# Patient Record
Sex: Male | Born: 1945
Health system: Southern US, Community
[De-identification: ages and names within clinical notes are randomized; demographics above are authoritative.]

## PROBLEM LIST (undated history)

## (undated) DIAGNOSIS — I10 Essential (primary) hypertension: Secondary | ICD-10-CM

## (undated) DIAGNOSIS — R609 Edema, unspecified: Secondary | ICD-10-CM

## (undated) DIAGNOSIS — U071 COVID-19: Secondary | ICD-10-CM

## (undated) DIAGNOSIS — K5792 Diverticulitis of intestine, part unspecified, without perforation or abscess without bleeding: Secondary | ICD-10-CM

## (undated) DIAGNOSIS — M1990 Unspecified osteoarthritis, unspecified site: Secondary | ICD-10-CM

## (undated) DIAGNOSIS — C801 Malignant (primary) neoplasm, unspecified: Secondary | ICD-10-CM

## (undated) DIAGNOSIS — K769 Liver disease, unspecified: Secondary | ICD-10-CM

## (undated) HISTORY — DX: Essential (primary) hypertension: I10

## (undated) HISTORY — DX: COVID-19: U07.1

## (undated) HISTORY — PX: BACK SURGERY: SHX140

## (undated) HISTORY — DX: Liver disease, unspecified: K76.9

## (undated) HISTORY — PX: VASECTOMY: SHX75

---

## 2004-10-20 DIAGNOSIS — I10 Essential (primary) hypertension: Secondary | ICD-10-CM | POA: Insufficient documentation

## 2005-04-20 DIAGNOSIS — Z720 Tobacco use: Secondary | ICD-10-CM | POA: Insufficient documentation

## 2005-10-22 DIAGNOSIS — R319 Hematuria, unspecified: Secondary | ICD-10-CM | POA: Insufficient documentation

## 2006-10-08 DIAGNOSIS — R509 Fever, unspecified: Secondary | ICD-10-CM | POA: Insufficient documentation

## 2006-10-17 DIAGNOSIS — N419 Inflammatory disease of prostate, unspecified: Secondary | ICD-10-CM | POA: Insufficient documentation

## 2007-02-05 DIAGNOSIS — H1030 Unspecified acute conjunctivitis, unspecified eye: Secondary | ICD-10-CM | POA: Insufficient documentation

## 2012-06-27 ENCOUNTER — Ambulatory Visit: Payer: Self-pay | Admitting: Family Medicine

## 2013-01-12 ENCOUNTER — Ambulatory Visit: Payer: Self-pay | Admitting: Family Medicine

## 2013-02-13 ENCOUNTER — Ambulatory Visit: Payer: Self-pay | Admitting: Diagnostic Radiology

## 2013-02-13 ENCOUNTER — Ambulatory Visit: Payer: Self-pay | Admitting: Family Medicine

## 2013-03-17 HISTORY — PX: MICRODISCECTOMY LUMBAR: SUR864

## 2014-04-16 DIAGNOSIS — M545 Low back pain, unspecified: Secondary | ICD-10-CM | POA: Insufficient documentation

## 2014-04-16 DIAGNOSIS — B019 Varicella without complication: Secondary | ICD-10-CM | POA: Insufficient documentation

## 2014-04-16 DIAGNOSIS — M5126 Other intervertebral disc displacement, lumbar region: Secondary | ICD-10-CM | POA: Insufficient documentation

## 2014-04-16 DIAGNOSIS — Z23 Encounter for immunization: Secondary | ICD-10-CM | POA: Insufficient documentation

## 2014-04-16 DIAGNOSIS — Z9229 Personal history of other drug therapy: Secondary | ICD-10-CM | POA: Insufficient documentation

## 2014-04-16 DIAGNOSIS — Z87891 Personal history of nicotine dependence: Secondary | ICD-10-CM | POA: Insufficient documentation

## 2014-04-16 DIAGNOSIS — Z136 Encounter for screening for cardiovascular disorders: Secondary | ICD-10-CM | POA: Insufficient documentation

## 2014-04-16 DIAGNOSIS — Z125 Encounter for screening for malignant neoplasm of prostate: Secondary | ICD-10-CM | POA: Insufficient documentation

## 2014-05-26 DIAGNOSIS — H524 Presbyopia: Secondary | ICD-10-CM | POA: Diagnosis not present

## 2014-05-26 DIAGNOSIS — H5210 Myopia, unspecified eye: Secondary | ICD-10-CM | POA: Diagnosis not present

## 2014-06-29 ENCOUNTER — Ambulatory Visit (INDEPENDENT_AMBULATORY_CARE_PROVIDER_SITE_OTHER): Payer: Commercial Managed Care - HMO | Admitting: Family Medicine

## 2014-06-29 ENCOUNTER — Other Ambulatory Visit: Payer: Self-pay

## 2014-06-29 ENCOUNTER — Encounter: Payer: Self-pay | Admitting: Family Medicine

## 2014-06-29 VITALS — BP 138/70 | HR 56 | Temp 98.1°F | Resp 14 | Ht 68.0 in | Wt 179.8 lb

## 2014-06-29 DIAGNOSIS — I10 Essential (primary) hypertension: Secondary | ICD-10-CM

## 2014-06-29 DIAGNOSIS — Z0000 Encounter for general adult medical examination without abnormal findings: Secondary | ICD-10-CM | POA: Diagnosis not present

## 2014-06-29 LAB — HEMOCCULT GUIAC POC 1CARD (OFFICE): Fecal Occult Blood, POC: NEGATIVE

## 2014-06-29 LAB — POCT URINALYSIS DIPSTICK
Bilirubin, UA: NEGATIVE
Glucose, UA: NEGATIVE
Ketones, UA: NEGATIVE
LEUKOCYTES UA: NEGATIVE
Nitrite, UA: NEGATIVE
PH UA: 7
RBC UA: NEGATIVE
Spec Grav, UA: 1.015
UROBILINOGEN UA: 0.2

## 2014-06-29 NOTE — Progress Notes (Signed)
Patient: Kenneth Collins, Male    DOB: June 06, 1945, 69 y.o.   MRN: [id number] Visit Date: 06/29/2014  Today's Provider: Vernie Murders, PA   Chief Complaint  Patient presents with  . Annual Exam   Subjective:  Kenneth Collins is a 69 y.o. male who presents today for health maintenance and complete physical. He feels well. He reports exercising by 15 minutes of climbing stairs and then weight lifting for an hour.Marland Kitchen He reports he is sleeping fairly well (5-7 hours a night).   Review of Systems  Constitutional: Negative.   HENT: Negative.   Respiratory: Negative.   Cardiovascular: Negative.   Gastrointestinal: Negative.   Endocrine: Negative.   Genitourinary: Negative.   Musculoskeletal: Positive for arthralgias. Negative for joint swelling and gait problem.       Has had gout in the right first MTP joint 3 times between January and March 2016. Has made diet changes and all the symptoms have stopped.  Skin: Negative.   Neurological: Negative.   Hematological: Negative.   Psychiatric/Behavioral: Negative.     History   Social History  . Marital Status: Married    Spouse Name: N/A  . Number of Children: N/A  . Years of Education: N/A   Occupational History  . Not on file.   Social History Main Topics  . Smoking status: Former Smoker    Types: Cigarettes  . Smokeless tobacco: Not on file  . Alcohol Use: No  . Drug Use: No  . Sexual Activity: Not on file   Other Topics Concern  . Not on file   Social History Narrative    Patient Active Problem List   Diagnosis Date Noted  . Chicken pox 04/16/2014  . Displacement of lumbar intervertebral disc without myelopathy 04/16/2014  . Personal history of tobacco use, presenting hazards to health 04/16/2014  . Received influenza vaccination at hospital 04/16/2014  . LBP (low back pain) 04/16/2014  . Need for vaccination 04/16/2014  . Pneumococcal vaccination given 04/16/2014  . Special screening for malignant neoplasm of prostate  04/16/2014  . Screening for ischemic heart disease 04/16/2014  . Basal cell papilloma 05/07/2008  . Screening for lipoid disorders 05/07/2008  . Acute conjunctivitis 02/05/2007  . Acute upper respiratory infection 10/17/2006  . Prostatitis 10/17/2006  . Difficult or painful urination 10/08/2006  . Febrile 10/08/2006  . Routine general medical examination at a health care facility 10/22/2005  . Blood in the urine 10/22/2005  . Tobacco use 04/20/2005  . Essential (primary) hypertension 10/20/2004    Past Surgical History  Procedure Laterality Date  . Vasectomy    . Microdiscectomy lumbar  03/17/2013    His family history includes Cancer in his maternal uncle; Heart disease in his father; Pneumonia in his mother.    Outpatient Prescriptions Prior to Visit  Medication Sig Dispense Refill  . Ascorbic Acid (VITAMIN C) 500 MG CAPS     . aspirin 81 MG tablet Take by mouth.    Marland Kitchen atenolol-chlorthalidone (TENORETIC) 50-25 MG per tablet Take 1 tablet by mouth daily.    . cholecalciferol (VITAMIN D) 1000 UNITS tablet     . co-enzyme Q-10 30 MG capsule     . MULTIPLE VITAMIN PO Take 1 tablet by mouth daily.    .       No facility-administered medications prior to visit.    Patient Care Team: Margo Common, PA as PCP - General (Physician Assistant)     Objective:   Vitals:  Filed Vitals:  06/29/14 1002  BP: 138/70  Pulse: 56  Temp: 98.1 F (36.7 C)  TempSrc: Oral  Resp: 14  Height: 5\' 8"  (1.727 m)  Weight: 179 lb 12.8 oz (81.557 kg)    Physical Exam  Constitutional: He appears well-developed and well-nourished.  HENT:  Head: Normocephalic and atraumatic.  Right Ear: External ear normal.  Left Ear: External ear normal.  Nose: Nose normal.  Mouth/Throat: Oropharynx is clear and moist.  Eyes: Conjunctivae and EOM are normal.  Neck: Normal range of motion. Neck supple.  Cardiovascular: Normal rate and intact distal pulses.   Pulmonary/Chest: Effort normal and  breath sounds normal.  Abdominal: Soft. Bowel sounds are normal.  Genitourinary: Rectum normal and prostate normal. Guaiac negative stool.  Musculoskeletal: Normal range of motion.  Neurological: He is alert. He has normal reflexes.  Skin: Skin is warm and dry.  Psychiatric: He has a normal mood and affect. His behavior is normal. Thought content normal.     Depression Screen PHQ 2/9 Scores 06/29/2014  PHQ - 2 Score 0      Assessment & Plan:    Routine Health Maintenance and Physical Exam - Good general health. Has recovered very well from lumbar microdiskectomy in 2015. Exercises regularly and continues to work. Last eye exam was May 2016 and dental exam scheduled 07-29-14. No falling incidences and no depression. Normal functional status. Blood pressure well controlled on Tenoretic daily and continues to take an ASA daily. Recheck annually.   Immunization History  Administered Date(s) Administered  . Pneumococcal Polysaccharide-23 10/09/2011  . Tdap 10/19/2010    Health Maintenance  Topic Date Due  . COLONOSCOPY  2006 - in [place]  . ZOSTAVAX  10/01/2003  . PNA vac Low Risk Adult (2 of 2 - PCV13) 10/08/2012  . INFLUENZA VACCINE  08/02/2014  . TETANUS/TDAP  10/18/2020      Discussed health benefits of physical activity, and encouraged him to engage in regular exercise appropriate for his age and condition.    ------------------------------------------------------------------------------------------------------------

## 2014-06-30 LAB — CBC WITH DIFFERENTIAL/PLATELET
BASOS: 0 %
Basophils Absolute: 0 10*3/uL (ref 0.0–0.2)
EOS (ABSOLUTE): 0.2 10*3/uL (ref 0.0–0.4)
EOS: 2 %
HEMATOCRIT: 47.1 % (ref 37.5–51.0)
HEMOGLOBIN: 15.9 g/dL (ref 12.6–17.7)
Immature Grans (Abs): 0 10*3/uL (ref 0.0–0.1)
Immature Granulocytes: 0 %
LYMPHS ABS: 2.5 10*3/uL (ref 0.7–3.1)
LYMPHS: 27 %
MCH: 31.5 pg (ref 26.6–33.0)
MCHC: 33.8 g/dL (ref 31.5–35.7)
MCV: 94 fL (ref 79–97)
MONOCYTES: 6 %
Monocytes Absolute: 0.6 10*3/uL (ref 0.1–0.9)
NEUTROS ABS: 6 10*3/uL (ref 1.4–7.0)
Neutrophils: 65 %
Platelets: 202 10*3/uL (ref 150–379)
RBC: 5.04 x10E6/uL (ref 4.14–5.80)
RDW: 13.5 % (ref 12.3–15.4)
WBC: 9.3 10*3/uL (ref 3.4–10.8)

## 2014-06-30 LAB — COMPREHENSIVE METABOLIC PANEL
ALT: 39 IU/L (ref 0–44)
AST: 37 IU/L (ref 0–40)
Albumin/Globulin Ratio: 1.4 (ref 1.1–2.5)
Albumin: 3.8 g/dL (ref 3.5–4.8)
Alkaline Phosphatase: 59 IU/L (ref 39–117)
BUN/Creatinine Ratio: 18 (ref 10–22)
BUN: 30 mg/dL — ABNORMAL HIGH (ref 8–27)
Bilirubin Total: 0.6 mg/dL (ref 0.0–1.2)
CALCIUM: 9 mg/dL (ref 8.6–10.2)
CO2: 26 mmol/L (ref 18–29)
CREATININE: 1.63 mg/dL — AB (ref 0.76–1.27)
Chloride: 101 mmol/L (ref 97–108)
GFR calc Af Amer: 49 mL/min/{1.73_m2} — ABNORMAL LOW (ref >59)
GFR calc non Af Amer: 42 mL/min/{1.73_m2} — ABNORMAL LOW (ref >59)
Globulin, Total: 2.7 g/dL (ref 1.5–4.5)
Glucose: 101 mg/dL — ABNORMAL HIGH (ref 65–99)
Potassium: 4.3 mmol/L (ref 3.5–5.2)
Sodium: 141 mmol/L (ref 134–144)
TOTAL PROTEIN: 6.5 g/dL (ref 6.0–8.5)

## 2014-06-30 LAB — LIPID PANEL
CHOLESTEROL TOTAL: 159 mg/dL (ref 100–199)
Chol/HDL Ratio: 3 ratio units (ref 0.0–5.0)
HDL: 53 mg/dL (ref >39)
LDL CALC: 90 mg/dL (ref 0–99)
TRIGLYCERIDES: 78 mg/dL (ref 0–149)
VLDL Cholesterol Cal: 16 mg/dL (ref 5–40)

## 2014-06-30 LAB — TSH: TSH: 1.72 u[IU]/mL (ref 0.450–4.500)

## 2014-07-01 ENCOUNTER — Telehealth: Payer: Self-pay

## 2014-07-01 NOTE — Telephone Encounter (Signed)
-----   Message from Margo Common, [place] sent at 07/01/2014 12:51 AM EDT ----- Normal blood cell counts, thyroid test and cholesterol levels. Creatinine level elevated and usually indicates stress on kidney function. Increase water intake and recheck levels in 3-4 weeks to evaluate progress.

## 2014-07-01 NOTE — Telephone Encounter (Signed)
Patient advised as directed below. Patient verbalized understanding and agrees with treatment plan. Patient scheduled for follow up appointment on 07/29/14.

## 2014-07-29 ENCOUNTER — Encounter: Payer: Self-pay | Admitting: Family Medicine

## 2014-07-29 ENCOUNTER — Ambulatory Visit (INDEPENDENT_AMBULATORY_CARE_PROVIDER_SITE_OTHER): Payer: Commercial Managed Care - HMO | Admitting: Family Medicine

## 2014-07-29 VITALS — BP 110/68 | HR 56 | Temp 98.4°F | Resp 16 | Wt 181.4 lb

## 2014-07-29 DIAGNOSIS — R7989 Other specified abnormal findings of blood chemistry: Secondary | ICD-10-CM

## 2014-07-29 DIAGNOSIS — R748 Abnormal levels of other serum enzymes: Secondary | ICD-10-CM

## 2014-07-29 NOTE — Progress Notes (Signed)
Subjective:    Patient ID: Kenneth Collins, male    DOB: 09-14-1945, 68 y.o.   MRN: [id number] Chief Complaint  Patient presents with  . Follow-up    HPI  Patient Active Problem List   Diagnosis Date Noted  . Chicken pox 04/16/2014  . Displacement of lumbar intervertebral disc without myelopathy 04/16/2014  . Personal history of tobacco use, presenting hazards to health 04/16/2014  . Received influenza vaccination at hospital 04/16/2014  . LBP (low back pain) 04/16/2014  . Need for vaccination 04/16/2014  . Pneumococcal vaccination given 04/16/2014  . Special screening for malignant neoplasm of prostate 04/16/2014  . Screening for ischemic heart disease 04/16/2014  . Basal cell papilloma 05/07/2008  . Screening for lipoid disorders 05/07/2008  . Acute conjunctivitis 02/05/2007  . Acute upper respiratory infection 10/17/2006  . Prostatitis 10/17/2006  . Difficult or painful urination 10/08/2006  . Febrile 10/08/2006  . Routine general medical examination at a health care facility 10/22/2005  . Blood in the urine 10/22/2005  . Tobacco use 04/20/2005  . Essential (primary) hypertension 10/20/2004   Past Surgical History  Procedure Laterality Date  . Vasectomy    . Microdiscectomy lumbar  03/17/2013   History  Substance Use Topics  . Smoking status: Former Smoker    Types: Cigarettes  . Smokeless tobacco: Not on file  . Alcohol Use: No   Family History  Problem Relation Age of Onset  . Heart disease Father   . Cancer Maternal Uncle   . Pneumonia Mother    Current Outpatient Prescriptions on File Prior to Visit  Medication Sig Dispense Refill  . Ascorbic Acid (VITAMIN C) 500 MG CAPS     . aspirin 81 MG tablet Take by mouth.    Marland Kitchen atenolol-chlorthalidone (TENORETIC) 50-25 MG per tablet Take 1 tablet by mouth daily.    . cholecalciferol (VITAMIN D) 1000 UNITS tablet     . co-enzyme Q-10 30 MG capsule     . MULTIPLE VITAMIN PO Take 1 tablet by mouth daily.    Marland Kitchen  NAPROXEN SODIUM PO      No current facility-administered medications on file prior to visit.   No Known Allergies  Review of Systems  Constitutional: Negative.   HENT: Negative.   Respiratory: Negative.   Cardiovascular: Negative.   Gastrointestinal: Negative.   Endocrine: Negative.   Genitourinary: Negative.   Hematological: Negative.       BP 110/68 mmHg  Pulse 56  Temp(Src) 98.4 F (36.9 C) (Oral)  Resp 16  Wt 181 lb 6.4 oz (82.283 kg)  Objective:   Physical Exam  Constitutional: He is oriented to person, place, and time. He appears well-developed and well-nourished. No distress.  HENT:  Head: Normocephalic and atraumatic.  Right Ear: Hearing normal.  Left Ear: Hearing normal.  Nose: Nose normal.  Eyes: Conjunctivae and lids are normal. Pupils are equal, round, and reactive to light. Right eye exhibits no discharge. Left eye exhibits no discharge. No scleral icterus.  Cardiovascular: Normal rate and regular rhythm.   Pulmonary/Chest: Effort normal. No respiratory distress.  Abdominal: Soft. Bowel sounds are normal.  Musculoskeletal: Normal range of motion.  Neurological: He is alert and oriented to person, place, and time.  Skin: Skin is intact. No lesion and no rash noted.  Psychiatric: He has a normal mood and affect. His speech is normal and behavior is normal. Thought content normal.      Assessment & Plan:  1. Elevated serum creatinine Has been drinking  more water and feeling well. Normal BP reading and still taking Tenoretic for BP control. Will recheck renal function and follow up pending reports. - Renal Function Panel

## 2014-07-30 ENCOUNTER — Telehealth: Payer: Self-pay

## 2014-07-30 LAB — RENAL FUNCTION PANEL
Albumin: 3.9 g/dL (ref 3.5–4.8)
BUN/Creatinine Ratio: 19 (ref 10–22)
BUN: 28 mg/dL — AB (ref 8–27)
CO2: 27 mmol/L (ref 18–29)
Calcium: 8.8 mg/dL (ref 8.6–10.2)
Chloride: 100 mmol/L (ref 97–108)
Creatinine, Ser: 1.49 mg/dL — ABNORMAL HIGH (ref 0.76–1.27)
GFR, EST AFRICAN AMERICAN: 54 mL/min/{1.73_m2} — AB (ref >59)
GFR, EST NON AFRICAN AMERICAN: 47 mL/min/{1.73_m2} — AB (ref >59)
Glucose: 104 mg/dL — ABNORMAL HIGH (ref 65–99)
Phosphorus: 2.8 mg/dL (ref 2.5–4.5)
Potassium: 4.1 mmol/L (ref 3.5–5.2)
Sodium: 141 mmol/L (ref 134–144)

## 2014-07-30 NOTE — Telephone Encounter (Signed)
Left patient a detailed message on voicemail (consent in chart) advising him of message below.

## 2014-07-30 NOTE — Telephone Encounter (Signed)
-----   Message from Margo Common, [place] sent at 07/30/2014  2:16 PM EDT ----- Kidney function improving. Continue to drink extra water and limit heat exposure. Recheck levels in 3 months.

## 2014-11-29 ENCOUNTER — Other Ambulatory Visit: Payer: Self-pay | Admitting: Family Medicine

## 2015-06-01 DIAGNOSIS — H5210 Myopia, unspecified eye: Secondary | ICD-10-CM | POA: Diagnosis not present

## 2015-06-01 DIAGNOSIS — H524 Presbyopia: Secondary | ICD-10-CM | POA: Diagnosis not present

## 2015-07-12 ENCOUNTER — Ambulatory Visit (INDEPENDENT_AMBULATORY_CARE_PROVIDER_SITE_OTHER): Payer: Commercial Managed Care - HMO | Admitting: Family Medicine

## 2015-07-12 ENCOUNTER — Encounter: Payer: Self-pay | Admitting: Family Medicine

## 2015-07-12 VITALS — BP 130/72 | HR 56 | Temp 98.9°F | Resp 16 | Ht 66.5 in | Wt 188.0 lb

## 2015-07-12 DIAGNOSIS — Z1211 Encounter for screening for malignant neoplasm of colon: Secondary | ICD-10-CM

## 2015-07-12 DIAGNOSIS — I10 Essential (primary) hypertension: Secondary | ICD-10-CM | POA: Diagnosis not present

## 2015-07-12 DIAGNOSIS — Z0000 Encounter for general adult medical examination without abnormal findings: Secondary | ICD-10-CM

## 2015-07-12 NOTE — Progress Notes (Signed)
Patient: Kenneth Collins, Male    DOB: [birth year], 70 y.o.   MRN: [id number] Visit Date: 07/12/2015  Today's Provider: Vernie Murders, PA   Chief Complaint  Patient presents with  . Annual wellness exam   Subjective:    Annual wellness visit Kenneth Collins is a 70 y.o. male. He feels well. He reports exercising 3 days a week. He reports he is sleeping well.  -----------------------------------------------------------    Review of Systems  Constitutional: Negative.   HENT: Negative.   Eyes: Negative.   Respiratory: Negative.   Cardiovascular: Negative.   Gastrointestinal: Negative.   Endocrine: Negative.   Genitourinary: Negative.   Musculoskeletal: Negative.   Skin: Negative.   Allergic/Immunologic: Negative.   Neurological: Negative.   Hematological: Negative.   Psychiatric/Behavioral: Negative.     Social History   Social History  . Marital Status: Married    Spouse Name: N/A  . Number of Children: N/A  . Years of Education: N/A   Occupational History  . Not on file.   Social History Main Topics  . Smoking status: Former Smoker    Types: Cigarettes  . Smokeless tobacco: Not on file  . Alcohol Use: 0.0 oz/week    0 Standard drinks or equivalent per week     Comment: 1-2 drinks a month.   . Drug Use: No  . Sexual Activity: Not on file   Other Topics Concern  . Not on file   Social History Narrative    No past medical history on file.   Patient Active Problem List   Diagnosis Date Noted  . Chicken pox 04/16/2014  . Displacement of lumbar intervertebral disc without myelopathy 04/16/2014  . Personal history of tobacco use, presenting hazards to health 04/16/2014  . Received influenza vaccination at hospital 04/16/2014  . LBP (low back pain) 04/16/2014  . Need for vaccination 04/16/2014  . Pneumococcal vaccination given 04/16/2014  . Special screening for malignant neoplasm of prostate 04/16/2014  . Screening for ischemic heart disease  04/16/2014  . Basal cell papilloma 05/07/2008  . Screening for lipoid disorders 05/07/2008  . Acute conjunctivitis 02/05/2007  . Acute upper respiratory infection 10/17/2006  . Prostatitis 10/17/2006  . Difficult or painful urination 10/08/2006  . Febrile 10/08/2006  . Routine general medical examination at a health care facility 10/22/2005  . Blood in the urine 10/22/2005  . Tobacco use 04/20/2005  . Essential (primary) hypertension 10/20/2004    Past Surgical History  Procedure Laterality Date  . Vasectomy    . Microdiscectomy lumbar  03/17/2013    His family history includes Cancer in his maternal uncle; Heart disease in his father; Pneumonia in his mother.    Current Meds  Medication Sig  . Ascorbic Acid (VITAMIN C) 500 MG CAPS   . aspirin 81 MG tablet Take by mouth.  Marland Kitchen atenolol-chlorthalidone (TENORETIC) 50-25 MG tablet TAKE 1 TABLET EVERY DAY  . cholecalciferol (VITAMIN D) 1000 UNITS tablet   . co-enzyme Q-10 30 MG capsule   . MULTIPLE VITAMIN PO Take 1 tablet by mouth daily.  Marland Kitchen NAPROXEN SODIUM PO     Patient Care Team: Margo Common, PA as PCP - General (Physician Assistant)    Objective:   Vitals: BP 130/72 mmHg  Pulse 56  Temp(Src) 98.9 F (37.2 C)  Resp 16  Ht 5' 6.5" (1.689 m)  Wt 188 lb (85.276 kg)  BMI 29.89 kg/m2  Physical Exam  Activities of Daily Living In your present state  of health, do you have any difficulty performing the following activities: 07/12/2015  Hearing? N  Vision? N  Difficulty concentrating or making decisions? N  Walking or climbing stairs? N  Dressing or bathing? N  Doing errands, shopping? N    Fall Risk Assessment Fall Risk  07/12/2015 06/29/2014  Falls in the past year? No No     Depression Screen PHQ 2/9 Scores 07/12/2015 06/29/2014  PHQ - 2 Score 0 0    Cognitive Testing - 6-CIT  Correct? Score   What year is it? yes 0 0 or 4  What month is it? yes 0 0 or 3  Memorize:    Pia Mau,  40,  [address]   [place],      What time is it? (within 1 hour) yes 0 0 or 3  Count backwards from 20 yes 0 0, 2, or 4  Name the months of the year yes 0 0, 2, or 4  Repeat name & address above yes 0 0, 2, 4, 6, 8, or 10       TOTAL SCORE  0/28   Interpretation:  Normal  Normal (0-7) Abnormal (8-28)       Assessment & Plan:     Annual Wellness Visit  Reviewed patient's Family Medical History Reviewed and updated list of patient's medical providers Assessment of cognitive impairment was done Assessed patient's functional ability Established a written schedule for health screening [place] Completed and Reviewed  Exercise Activities and Dietary recommendations Goals    Goes to the gym 3 times a week for an 1.5 hour workout with wife (30 minutes on treadmill and 60 minutes with weights)      Immunization History  Administered Date(s) Administered  . Pneumococcal Polysaccharide-23 10/09/2011  . Tdap 10/19/2010    Health Maintenance  Topic Date Due  . Hepatitis C Screening  [birth year]  . COLONOSCOPY  09/30/1993  . ZOSTAVAX  10/01/2003  . PNA vac Low Risk Adult (2 of 2 - PCV13) 10/08/2012  . INFLUENZA VACCINE  08/02/2015  . TETANUS/TDAP  10/18/2020       Discussed health benefits of physical activity, and encouraged him to engage in regular exercise appropriate for his age and condition.    ------------------------------------------------------------------------------------------------------------ 1. Encounter for Medicare annual wellness exam Good general health with history of lumbar microdiscectomy in 2015 without subsequent back pain. Good activity and exercise program. Will get labs for hep C screening in the future. Will check with insurance about coverage for Zostavax and Prevnar immunizations. - Hepatitis C Antibody; Future  2. Essential (primary) hypertension Stable and well controlled on the Tenoretic. Schedule labs and follow up in 6 months. - CBC  with Differential/Platelet; Future - Comprehensive metabolic panel; Future - Lipid panel; Future - TSH; Future  3. Colon cancer screening Last colonoscopy was normal without polyps in [place] in 2006. Will schedule follow up colonoscopy screening. - Ambulatory referral to Gastroenterology    Vernie Murders, [facility]

## 2015-07-15 ENCOUNTER — Other Ambulatory Visit: Payer: Self-pay

## 2015-07-15 ENCOUNTER — Telehealth: Payer: Self-pay

## 2015-07-15 NOTE — Telephone Encounter (Signed)
Gastroenterology Pre-Procedure Review  Request Date: 08/09/2015   Requesting Physician: Dr. Natale Milch  PATIENT REVIEW QUESTIONS: The patient responded to the following health history questions as indicated:    1. Are you having any GI issues? no 2. Do you have a personal history of Polyps? no 3. Do you have a family history of Colon Cancer or Polyps? no 4. Diabetes Mellitus? no 5. Joint replacements in the past 12 months?no 6. Major health problems in the past 3 months?no 7. Any artificial heart valves, MVP, or defibrillator?no    MEDICATIONS & ALLERGIES:    Patient reports the following regarding taking any anticoagulation/antiplatelet therapy:   Plavix, Coumadin, Eliquis, Xarelto, Lovenox, Pradaxa, Brilinta, or Effient? no Aspirin? yes (blood thinners)  Patient confirms/reports the following medications:  Current Outpatient Prescriptions  Medication Sig Dispense Refill  . Ascorbic Acid (VITAMIN C) 500 MG CAPS     . aspirin 81 MG tablet Take by mouth.    Marland Kitchen atenolol-chlorthalidone (TENORETIC) 50-25 MG tablet TAKE 1 TABLET EVERY DAY 90 tablet 3  . cholecalciferol (VITAMIN D) 1000 UNITS tablet     . co-enzyme Q-10 30 MG capsule     . MULTIPLE VITAMIN PO Take 1 tablet by mouth daily.    Marland Kitchen NAPROXEN SODIUM PO Reported on 07/15/2015     No current facility-administered medications for this visit.    Patient confirms/reports the following allergies:  No Known Allergies  No orders of the defined types were placed in this encounter.    AUTHORIZATION INFORMATION Primary Insurance: 1D#: Group #:  Secondary Insurance: 1D#: Group #:  SCHEDULE INFORMATION: Date: 08/09/2015 Time: Location: [facility]

## 2015-08-01 NOTE — Telephone Encounter (Signed)
Per [place], CPT code [id number] does not require Pre-Authorization. This Josem Kaufmann will be closed. If you have any questions, please reply or contact our call center at [phone].

## 2015-08-02 ENCOUNTER — Encounter: Payer: Self-pay | Admitting: Family Medicine

## 2015-08-02 ENCOUNTER — Ambulatory Visit (INDEPENDENT_AMBULATORY_CARE_PROVIDER_SITE_OTHER): Payer: Commercial Managed Care - HMO | Admitting: Family Medicine

## 2015-08-02 VITALS — BP 132/78 | HR 64 | Temp 100.2°F | Resp 20 | Wt 192.0 lb

## 2015-08-02 DIAGNOSIS — I10 Essential (primary) hypertension: Secondary | ICD-10-CM | POA: Diagnosis not present

## 2015-08-02 DIAGNOSIS — R1032 Left lower quadrant pain: Secondary | ICD-10-CM | POA: Diagnosis not present

## 2015-08-02 DIAGNOSIS — Z0000 Encounter for general adult medical examination without abnormal findings: Secondary | ICD-10-CM | POA: Diagnosis not present

## 2015-08-02 MED ORDER — CIPROFLOXACIN HCL 500 MG PO TABS: 500.0000 mg | ORAL_TABLET | Freq: Two times a day (BID) | ORAL | 0 refills | Status: DC

## 2015-08-02 NOTE — Progress Notes (Signed)
Patient: Kenneth Collins Male    DOB: August 30, 1945   70 y.o.   MRN: [id number] Visit Date: 08/02/2015  Today's Provider: Vernie Murders, PA   Chief Complaint  Patient presents with  . Abdominal Pain    since this morning.    Subjective:    HPI Patient reports that he has had pain since 3am this morning (about 12 hours). Patient reports that the pain is located in his left side. He reports that he had chills, but denies fever. Patient reports that he was dehyrated all day yesterday and thinks that this may have started his symptoms. Patient reports that this morning his pain was a 10 on the pain scale of 1-10. After taking Ibuprofen around 12pm today, he reports that his pain level came down to a 4. 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   No past medical history on file. Patient Active Problem List   Diagnosis Date Noted  . Chicken pox 04/16/2014  . Displacement of lumbar intervertebral disc without myelopathy 04/16/2014  . Personal history of tobacco use, presenting hazards to health 04/16/2014  . Received influenza vaccination at hospital 04/16/2014  . LBP (low back pain) 04/16/2014  . Need for vaccination 04/16/2014  . Pneumococcal vaccination given 04/16/2014  . Special screening for malignant neoplasm of prostate 04/16/2014  . Screening for ischemic heart disease 04/16/2014  . Basal cell papilloma 05/07/2008  . Screening for lipoid disorders 05/07/2008  . Acute conjunctivitis 02/05/2007  . Acute upper respiratory infection 10/17/2006  . Prostatitis 10/17/2006  . Difficult or painful urination 10/08/2006  . Febrile 10/08/2006  . Routine general medical examination at a health care facility 10/22/2005  . Blood in the urine 10/22/2005  . Tobacco use 04/20/2005  . Essential (primary) hypertension 10/20/2004   Past Surgical History:  Procedure Laterality Date  . MICRODISCECTOMY LUMBAR  03/17/2013  . VASECTOMY     Family History    Problem Relation Age of Onset  . Heart disease Father   . Cancer Maternal Uncle   . Pneumonia Mother    No Known Allergies   Current Outpatient Prescriptions on File Prior to Visit  Medication Sig Dispense Refill  . Ascorbic Acid (VITAMIN C) 500 MG CAPS     . aspirin 81 MG tablet Take by mouth.    Marland Kitchen atenolol-chlorthalidone (TENORETIC) 50-25 MG tablet TAKE 1 TABLET EVERY DAY 90 tablet 3  . cholecalciferol (VITAMIN D) 1000 UNITS tablet     . co-enzyme Q-10 30 MG capsule     . MULTIPLE VITAMIN PO Take 1 tablet by mouth daily.    Marland Kitchen NAPROXEN SODIUM PO Reported on 07/15/2015     No current facility-administered medications on file prior to visit.    Review of Systems  Constitutional: Positive for chills, fatigue and fever. Negative for activity change, appetite change, diaphoresis and unexpected weight change.  Respiratory: Negative.   Cardiovascular: Negative.   Genitourinary: Negative for difficulty urinating and dysuria.   Social History  Substance Use Topics  . Smoking status: Former Smoker    Types: Cigarettes  . Smokeless tobacco: Not on file  . Alcohol use 0.0 oz/week     Comment: 1-2 drinks a month.    Objective:   BP 132/78 (BP Location: Right Arm, Patient Position: Sitting, Cuff Size: Normal)   Pulse 64   Temp 100.2 F (37.9 C)   Resp 20   Wt 192 lb (87.1 kg)  SpO2 98%   BMI 30.53 kg/m   Physical Exam  Constitutional: He is oriented to person, place, and time. He appears well-developed and well-nourished.  HENT:  Head: Normocephalic.  Neck: Neck supple.  Cardiovascular: Normal rate and regular rhythm.   Pulmonary/Chest: Effort normal.  Abdominal: Soft. Bowel sounds are normal. He exhibits no distension and no mass. There is tenderness. There is guarding. There is no rebound.  Some left lower quadrant tenderness to palpation with some guarding. No rigidity or rebound localization. BS WNL.  Neurological: He is alert and oriented to person, place, and time.       Assessment & Plan:     1. LLQ abdominal pain Onset this morning. No hematuria or dysuria. Some frequency and LLQ pain with movement. Had normal BM today without change in discomfort. Urinalysis showed some protein but no leukocytes, blood or nitrites. States he was sure he had a kidney stone but has never had one to know. Will get CBC and CMP lab tests. Add Cipro for any UTI or diverticulitis since he has been feeling fatigued and has fever. Recommend increased fluid intake and bland diet. Recheck pending lab reports. - CBC with Differential/Platelet - Comprehensive metabolic panel - POCT urinalysis dipstick  2. Essential (primary) hypertension Stable without tachycardia. Tolerating Tenoretic without side effects. Will recheck labs and continue present dosage.  - Lipid panel - TSH  3. Encounter for Medicare annual wellness exam Screening for Hepatitis C and plan physical exam later pending response - Hepatitis C Antibody       Vernie Murders, PA  [facility]

## 2015-08-03 DIAGNOSIS — R1032 Left lower quadrant pain: Secondary | ICD-10-CM | POA: Diagnosis not present

## 2015-08-03 DIAGNOSIS — I10 Essential (primary) hypertension: Secondary | ICD-10-CM | POA: Diagnosis not present

## 2015-08-03 DIAGNOSIS — Z0000 Encounter for general adult medical examination without abnormal findings: Secondary | ICD-10-CM | POA: Diagnosis not present

## 2015-08-04 LAB — CBC WITH DIFFERENTIAL/PLATELET
BASOS: 0 %
Basophils Absolute: 0 10*3/uL (ref 0.0–0.2)
EOS (ABSOLUTE): 0 10*3/uL (ref 0.0–0.4)
EOS: 0 %
HEMATOCRIT: 42.7 % (ref 37.5–51.0)
Hemoglobin: 14.9 g/dL (ref 12.6–17.7)
IMMATURE GRANS (ABS): 0 10*3/uL (ref 0.0–0.1)
IMMATURE GRANULOCYTES: 0 %
LYMPHS: 6 %
Lymphocytes Absolute: 0.9 10*3/uL (ref 0.7–3.1)
MCH: 31.4 pg (ref 26.6–33.0)
MCHC: 34.9 g/dL (ref 31.5–35.7)
MCV: 90 fL (ref 79–97)
Monocytes Absolute: 0.6 10*3/uL (ref 0.1–0.9)
Monocytes: 4 %
NEUTROS PCT: 90 %
Neutrophils Absolute: 14.8 10*3/uL — ABNORMAL HIGH (ref 1.4–7.0)
PLATELETS: 193 10*3/uL (ref 150–379)
RBC: 4.75 x10E6/uL (ref 4.14–5.80)
RDW: 13.5 % (ref 12.3–15.4)
WBC: 16.4 10*3/uL — AB (ref 3.4–10.8)

## 2015-08-04 LAB — COMPREHENSIVE METABOLIC PANEL
A/G RATIO: 1.3 (ref 1.2–2.2)
ALT: 23 IU/L (ref 0–44)
AST: 20 IU/L (ref 0–40)
Albumin: 3.4 g/dL — ABNORMAL LOW (ref 3.5–4.8)
Alkaline Phosphatase: 54 IU/L (ref 39–117)
BUN/Creatinine Ratio: 15 (ref 10–24)
BUN: 30 mg/dL — ABNORMAL HIGH (ref 8–27)
Bilirubin Total: 1.1 mg/dL (ref 0.0–1.2)
CALCIUM: 8.3 mg/dL — AB (ref 8.6–10.2)
CO2: 25 mmol/L (ref 18–29)
CREATININE: 1.96 mg/dL — AB (ref 0.76–1.27)
Chloride: 95 mmol/L — ABNORMAL LOW (ref 96–106)
GFR, EST AFRICAN AMERICAN: 39 mL/min/{1.73_m2} — AB (ref >59)
GFR, EST NON AFRICAN AMERICAN: 33 mL/min/{1.73_m2} — AB (ref >59)
Globulin, Total: 2.7 g/dL (ref 1.5–4.5)
Glucose: 129 mg/dL — ABNORMAL HIGH (ref 65–99)
POTASSIUM: 3.5 mmol/L (ref 3.5–5.2)
Sodium: 137 mmol/L (ref 134–144)
TOTAL PROTEIN: 6.1 g/dL (ref 6.0–8.5)

## 2015-08-04 LAB — LIPID PANEL
CHOLESTEROL TOTAL: 119 mg/dL (ref 100–199)
Chol/HDL Ratio: 2.2 ratio units (ref 0.0–5.0)
HDL: 54 mg/dL (ref >39)
LDL Calculated: 54 mg/dL (ref 0–99)
TRIGLYCERIDES: 54 mg/dL (ref 0–149)
VLDL Cholesterol Cal: 11 mg/dL (ref 5–40)

## 2015-08-04 LAB — HEPATITIS C ANTIBODY: Hep C Virus Ab: 0.1 s/co ratio (ref 0.0–0.9)

## 2015-08-04 LAB — TSH: TSH: 0.734 u[IU]/mL (ref 0.450–4.500)

## 2015-08-05 LAB — POCT URINALYSIS DIPSTICK
BILIRUBIN UA: NEGATIVE
GLUCOSE UA: NEGATIVE
Ketones, UA: NEGATIVE
Leukocytes, UA: NEGATIVE
NITRITE UA: NEGATIVE
RBC UA: NEGATIVE
Spec Grav, UA: <=1.005
Urobilinogen, UA: 0.2
pH, UA: 6

## 2015-09-01 ENCOUNTER — Encounter: Payer: Self-pay | Admitting: Family Medicine

## 2015-09-01 ENCOUNTER — Ambulatory Visit (INDEPENDENT_AMBULATORY_CARE_PROVIDER_SITE_OTHER): Payer: Commercial Managed Care - HMO | Admitting: Family Medicine

## 2015-09-01 VITALS — BP 132/72 | HR 59 | Temp 98.3°F | Resp 14 | Wt 188.8 lb

## 2015-09-01 DIAGNOSIS — R748 Abnormal levels of other serum enzymes: Secondary | ICD-10-CM

## 2015-09-01 DIAGNOSIS — R7989 Other specified abnormal findings of blood chemistry: Secondary | ICD-10-CM

## 2015-09-01 DIAGNOSIS — R1032 Left lower quadrant pain: Secondary | ICD-10-CM | POA: Diagnosis not present

## 2015-09-01 MED ORDER — CIPROFLOXACIN HCL 500 MG PO TABS: 500.0000 mg | ORAL_TABLET | Freq: Two times a day (BID) | ORAL | 0 refills | Status: DC

## 2015-09-01 NOTE — Progress Notes (Signed)
Patient: Kenneth Collins Male    DOB: [birth year]   70 y.o.   MRN: [id number] Visit Date: 09/01/2015  Today's Provider: Vernie Murders, PA   Chief Complaint  Patient presents with  . Abdominal Pain   Subjective:    Abdominal Pain  This is a recurrent problem. The current episode started yesterday. The onset quality is sudden. The most recent episode lasted 1 day. Progression since onset: pain subsided today. The pain is located in the LLQ. The patient is experiencing no pain. He has tried antibiotics for the symptoms.   History reviewed. No pertinent past medical history. Patient Active Problem List   Diagnosis Date Noted  . Chicken pox 04/16/2014  . Displacement of lumbar intervertebral disc without myelopathy 04/16/2014  . Personal history of tobacco use, presenting hazards to health 04/16/2014  . Received influenza vaccination at hospital 04/16/2014  . LBP (low back pain) 04/16/2014  . Need for vaccination 04/16/2014  . Pneumococcal vaccination given 04/16/2014  . Special screening for malignant neoplasm of prostate 04/16/2014  . Screening for ischemic heart disease 04/16/2014  . Basal cell papilloma 05/07/2008  . Screening for lipoid disorders 05/07/2008  . Acute conjunctivitis 02/05/2007  . Acute upper respiratory infection 10/17/2006  . Prostatitis 10/17/2006  . Difficult or painful urination 10/08/2006  . Febrile 10/08/2006  . Routine general medical examination at a health care facility 10/22/2005  . Blood in the urine 10/22/2005  . Tobacco use 04/20/2005  . Essential (primary) hypertension 10/20/2004   Past Surgical History:  Procedure Laterality Date  . MICRODISCECTOMY LUMBAR  03/17/2013  . VASECTOMY     Family History  Problem Relation Age of Onset  . Heart disease Father   . Pneumonia Mother   . Cancer Maternal Uncle    No Known Allergies  Previous Medications   ASCORBIC ACID (VITAMIN C) 500 MG CAPS       ASPIRIN 81 MG TABLET    Take by mouth.   ATENOLOL-CHLORTHALIDONE (TENORETIC) 50-25 MG TABLET    TAKE 1 TABLET EVERY DAY   CHOLECALCIFEROL (VITAMIN D) 1000 UNITS TABLET       CIPROFLOXACIN (CIPRO) 500 MG TABLET    Take 1 tablet (500 mg total) by mouth 2 (two) times daily.   CO-ENZYME Q-10 30 MG CAPSULE       MULTIPLE VITAMIN PO    Take 1 tablet by mouth daily.   NAPROXEN SODIUM PO    Reported on 07/15/2015    Review of Systems  Constitutional: Negative.   Respiratory: Negative.   Cardiovascular: Negative.   Gastrointestinal: Positive for abdominal pain.    Social History  Substance Use Topics  . Smoking status: Former Smoker    Types: Cigarettes  . Smokeless tobacco: Not on file  . Alcohol use 0.0 oz/week     Comment: 1-2 drinks a month.    Objective:   BP 132/72 (BP Location: Right Arm, Patient Position: Sitting, Cuff Size: Normal)   Pulse (!) 59   Temp 98.3 F (36.8 C) (Oral)   Resp 14   Wt 188 lb 12.8 oz (85.6 kg)   BMI 30.02 kg/m   Physical Exam  Constitutional: He is oriented to person, place, and time. He appears well-developed and well-nourished. No distress.  HENT:  Head: Normocephalic and atraumatic.  Right Ear: Hearing normal.  Left Ear: Hearing normal.  Nose: Nose normal.  Eyes: Conjunctivae and lids are normal. Right eye exhibits no discharge. Left eye exhibits no discharge. No scleral icterus.  Neck:  Neck supple.  Cardiovascular: Normal rate and regular rhythm.   Pulmonary/Chest: Effort normal and breath sounds normal. No respiratory distress.  Abdominal: Soft. Bowel sounds are normal. He exhibits no mass. There is tenderness. There is no rebound and no guarding.  Mild LLQ soreness.  Musculoskeletal: Normal range of motion.  Neurological: He is alert and oriented to person, place, and time.  Skin: Skin is intact. No lesion and no rash noted.  Psychiatric: He has a normal mood and affect. His speech is normal and behavior is normal. Thought content normal.      Assessment & Plan:     1. LLQ  abdominal pain Recurrence of LLQ soreness/pain yesterday. Similar to bout 08-02-15 that responded to Cipro and bland diet with increased fluid intake. Creatinine was up to 1.96 at that time. Denies fever, diarrhea, constipation, hematuria or nausea this time. Will refill Cipro and schedule CT scan. Has appointment for colonoscopy by Dr. Allen Norris on 09-20-15. Feeling better today. Recheck pending lab and CT report. - CBC with Differential/Platelet - Comprehensive metabolic panel - CT Abdomen Pelvis Wo Contrast - ciprofloxacin (CIPRO) 500 MG tablet; Take 1 tablet (500 mg total) by mouth 2 (two) times daily.  Dispense: 20 tablet; Refill: 0  2. Elevated serum creatinine Up to 1.96 on 08-05-15. Will recheck labs. No hematuria, hematochezia or hematemesis. Recheck pending reports. - CBC with Differential/Platelet - Comprehensive metabolic panel

## 2015-09-02 ENCOUNTER — Telehealth: Payer: Self-pay

## 2015-09-02 ENCOUNTER — Other Ambulatory Visit: Payer: Self-pay | Admitting: Family Medicine

## 2015-09-02 DIAGNOSIS — R1032 Left lower quadrant pain: Secondary | ICD-10-CM

## 2015-09-02 LAB — CBC WITH DIFFERENTIAL/PLATELET
BASOS ABS: 0 10*3/uL (ref 0.0–0.2)
Basos: 0 %
EOS (ABSOLUTE): 0.2 10*3/uL (ref 0.0–0.4)
Eos: 2 %
HEMOGLOBIN: 14.5 g/dL (ref 12.6–17.7)
Hematocrit: 43.7 % (ref 37.5–51.0)
Immature Grans (Abs): 0 10*3/uL (ref 0.0–0.1)
Immature Granulocytes: 0 %
LYMPHS ABS: 1.2 10*3/uL (ref 0.7–3.1)
Lymphs: 14 %
MCH: 30.6 pg (ref 26.6–33.0)
MCHC: 33.2 g/dL (ref 31.5–35.7)
MCV: 92 fL (ref 79–97)
MONOCYTES: 10 %
MONOS ABS: 0.8 10*3/uL (ref 0.1–0.9)
NEUTROS ABS: 6.2 10*3/uL (ref 1.4–7.0)
Neutrophils: 74 %
Platelets: 188 10*3/uL (ref 150–379)
RBC: 4.74 x10E6/uL (ref 4.14–5.80)
RDW: 13.6 % (ref 12.3–15.4)
WBC: 8.4 10*3/uL (ref 3.4–10.8)

## 2015-09-02 LAB — COMPREHENSIVE METABOLIC PANEL
ALT: 20 IU/L (ref 0–44)
AST: 16 IU/L (ref 0–40)
Albumin/Globulin Ratio: 1.4 (ref 1.2–2.2)
Albumin: 4 g/dL (ref 3.5–4.8)
Alkaline Phosphatase: 60 IU/L (ref 39–117)
BUN/Creatinine Ratio: 25 — ABNORMAL HIGH (ref 10–24)
BUN: 42 mg/dL — ABNORMAL HIGH (ref 8–27)
Bilirubin Total: 0.4 mg/dL (ref 0.0–1.2)
CO2: 26 mmol/L (ref 18–29)
Calcium: 9.1 mg/dL (ref 8.6–10.2)
Chloride: 99 mmol/L (ref 96–106)
Creatinine, Ser: 1.66 mg/dL — ABNORMAL HIGH (ref 0.76–1.27)
GFR calc Af Amer: 47 mL/min/{1.73_m2} — ABNORMAL LOW (ref >59)
GFR calc non Af Amer: 41 mL/min/{1.73_m2} — ABNORMAL LOW (ref >59)
Globulin, Total: 2.9 g/dL (ref 1.5–4.5)
Glucose: 92 mg/dL (ref 65–99)
Potassium: 4.5 mmol/L (ref 3.5–5.2)
Sodium: 140 mmol/L (ref 134–144)
Total Protein: 6.9 g/dL (ref 6.0–8.5)

## 2015-09-02 NOTE — Addendum Note (Signed)
Addended by: Jules Schick on: 09/02/2015 09:37 AM   Modules accepted: Orders

## 2015-09-02 NOTE — Telephone Encounter (Signed)
Advised patient of results. Patient reports that he is taking a creatine supplement 3x a week. He reports that this may be the cause of his creatine being elevated. Patient is still waiting to hear about when is CT is scheduled. Thanks!

## 2015-09-02 NOTE — Telephone Encounter (Signed)
-----   Message from Margo Common, [place] sent at 09/02/2015  8:21 AM EDT ----- No sign of infection at the present. Kidney function improving but creatinine still high. Proceed with CT scan and follow up pending report.

## 2015-09-02 NOTE — Telephone Encounter (Signed)
Advise patient when to go for his CT scan. Would like to get it today if possible.

## 2015-09-06 ENCOUNTER — Ambulatory Visit
Admission: RE | Admit: 2015-09-06 | Discharge: 2015-09-06 | Disposition: A | Discharge status: Home / Self Care | Payer: Commercial Managed Care - HMO | Source: Ambulatory Visit | Attending: Family Medicine | Admitting: Family Medicine

## 2015-09-06 DIAGNOSIS — K5730 Diverticulosis of large intestine without perforation or abscess without bleeding: Secondary | ICD-10-CM | POA: Insufficient documentation

## 2015-09-06 DIAGNOSIS — I700 Atherosclerosis of aorta: Secondary | ICD-10-CM | POA: Diagnosis not present

## 2015-09-06 DIAGNOSIS — I708 Atherosclerosis of other arteries: Secondary | ICD-10-CM | POA: Insufficient documentation

## 2015-09-06 DIAGNOSIS — R932 Abnormal findings on diagnostic imaging of liver and biliary tract: Secondary | ICD-10-CM | POA: Insufficient documentation

## 2015-09-06 DIAGNOSIS — R1032 Left lower quadrant pain: Secondary | ICD-10-CM | POA: Diagnosis not present

## 2015-09-07 ENCOUNTER — Telehealth: Payer: Self-pay

## 2015-09-07 MED ORDER — METRONIDAZOLE 500 MG PO TABS: 500.0000 mg | ORAL_TABLET | Freq: Two times a day (BID) | ORAL | 0 refills | Status: DC

## 2015-09-07 NOTE — Telephone Encounter (Signed)
-----   Message from Margo Common, [place] sent at 09/06/2015  7:00 PM EDT ----- CT scan did not show any kidney stones. Some signs of acute diverticulitis on the left and some small cysts in liver. Continue Cipro BID and may need to add Flagyl 500 mg BID #20. Proceed with low residue and increased liquid diet. Should keep appointment with Dr. Allen Norris for colonoscopy and let him know about findings on CT scan.

## 2015-09-07 NOTE — Telephone Encounter (Signed)
Patient advised as directed below. RX sent to pharmacy. Patient has appt with Dr. Allen Norris on 09/20/2015.

## 2015-09-20 ENCOUNTER — Ambulatory Visit: Payer: Commercial Managed Care - HMO | Admitting: Anesthesiology

## 2015-09-20 ENCOUNTER — Encounter
Admission: RE | Disposition: A | Discharge status: Home / Self Care | Payer: Self-pay | Source: Ambulatory Visit | Attending: Gastroenterology

## 2015-09-20 ENCOUNTER — Ambulatory Visit
Admission: RE | Admit: 2015-09-20 | Discharge: 2015-09-20 | Disposition: A | Discharge status: Home / Self Care | Payer: Commercial Managed Care - HMO | Source: Ambulatory Visit | Attending: Gastroenterology | Admitting: Gastroenterology

## 2015-09-20 DIAGNOSIS — Z791 Long term (current) use of non-steroidal anti-inflammatories (NSAID): Secondary | ICD-10-CM | POA: Diagnosis not present

## 2015-09-20 DIAGNOSIS — K633 Ulcer of intestine: Secondary | ICD-10-CM | POA: Diagnosis not present

## 2015-09-20 DIAGNOSIS — K5790 Diverticulosis of intestine, part unspecified, without perforation or abscess without bleeding: Secondary | ICD-10-CM | POA: Diagnosis not present

## 2015-09-20 DIAGNOSIS — Z1211 Encounter for screening for malignant neoplasm of colon: Secondary | ICD-10-CM | POA: Diagnosis not present

## 2015-09-20 DIAGNOSIS — Z79899 Other long term (current) drug therapy: Secondary | ICD-10-CM | POA: Insufficient documentation

## 2015-09-20 DIAGNOSIS — Z7982 Long term (current) use of aspirin: Secondary | ICD-10-CM | POA: Diagnosis not present

## 2015-09-20 DIAGNOSIS — K529 Noninfective gastroenteritis and colitis, unspecified: Secondary | ICD-10-CM | POA: Diagnosis not present

## 2015-09-20 DIAGNOSIS — I10 Essential (primary) hypertension: Secondary | ICD-10-CM | POA: Insufficient documentation

## 2015-09-20 DIAGNOSIS — K5730 Diverticulosis of large intestine without perforation or abscess without bleeding: Secondary | ICD-10-CM | POA: Insufficient documentation

## 2015-09-20 DIAGNOSIS — Z87891 Personal history of nicotine dependence: Secondary | ICD-10-CM | POA: Diagnosis not present

## 2015-09-20 HISTORY — PX: COLONOSCOPY WITH PROPOFOL: SHX5780

## 2015-09-20 SURGERY — COLONOSCOPY WITH PROPOFOL
Anesthesia: General

## 2015-09-20 MED ORDER — SODIUM CHLORIDE 0.9 % IV SOLN
INTRAVENOUS | Status: DC
  Administered 2015-09-20: 10:00:00 via INTRAVENOUS

## 2015-09-20 MED ORDER — LIDOCAINE HCL (CARDIAC) 20 MG/ML IV SOLN
INTRAVENOUS | Status: DC | PRN
Start: 2015-09-20 — End: 2015-09-20
  Administered 2015-09-20: 40 mg via INTRAVENOUS

## 2015-09-20 MED ORDER — ATENOLOL 25 MG PO TABS
ORAL_TABLET | ORAL | Status: AC
  Administered 2015-09-20: 50 mg via ORAL
  Filled 2015-09-20: qty 2

## 2015-09-20 MED ORDER — PROPOFOL 500 MG/50ML IV EMUL
INTRAVENOUS | Status: DC | PRN
  Administered 2015-09-20: 150 ug/kg/min via INTRAVENOUS

## 2015-09-20 MED ORDER — PROPOFOL 10 MG/ML IV BOLUS
INTRAVENOUS | Status: DC | PRN
  Administered 2015-09-20: 70 mg via INTRAVENOUS

## 2015-09-20 MED ORDER — ATENOLOL 25 MG PO TABS
50.0000 mg | ORAL_TABLET | Freq: Once | ORAL | Status: AC
  Administered 2015-09-20: 50 mg via ORAL

## 2015-09-20 NOTE — Op Note (Signed)
[facility] Gastroenterology Patient Name: Kenneth Collins Procedure Date: 09/20/2015 9:49 AM MRN: [id number] Account #: [id number] Date of Birth: 19-Apr-1945 Admit Type: Outpatient Age: 70 Room: [facility] ENDO ROOM 4 Gender: Male Note Status: Finalized Procedure:            Colonoscopy Indications:          Screening for colorectal malignant neoplasm Providers:            [name] MD, MD Referring MD:         Signe Colt (Referring MD) Medicines:            Propofol per Anesthesia Complications:        No immediate complications. Procedure:            Pre-Anesthesia Assessment:                       - Prior to the procedure, a History and Physical was                        performed, and patient medications and allergies were                        reviewed. The patient's tolerance of previous                        anesthesia was also reviewed. The risks and benefits of                        the procedure and the sedation options and risks were                        discussed with the patient. All questions were                        answered, and informed consent was obtained. Prior                        Anticoagulants: The patient has taken no previous                        anticoagulant or antiplatelet agents. ASA Grade                        Assessment: II - A patient with mild systemic disease.                        After reviewing the risks and benefits, the patient was                        deemed in satisfactory condition to undergo the                        procedure.                       After obtaining informed consent, the colonoscope was                        passed under direct vision. Throughout the procedure,  the patient's blood pressure, pulse, and oxygen                        saturations were monitored continuously. The                        Colonoscope was introduced through the anus and   advanced to the the cecum, identified by appendiceal                        orifice and ileocecal valve. The colonoscopy was                        performed without difficulty. The patient tolerated the                        procedure well. The quality of the bowel preparation                        was excellent. Findings:      The perianal and digital rectal examinations were normal.      Discontinuous areas of nonbleeding ulcerated mucosa with no stigmata of       recent bleeding were present in the entire colon. Biopsies were taken       with a cold forceps for histology.      Multiple small-mouthed diverticula were found in the entire colon. Impression:           - Mucosal ulceration. Biopsied.                       - Diverticulosis in the entire examined colon. Recommendation:       - Discharge patient to home.                       - Resume previous diet.                       - Continue present medications.                       - Await pathology results. Procedure Code(s):    --- Professional ---                       [phone], Colonoscopy, flexible; with biopsy, single or                        multiple Diagnosis Code(s):    --- Professional ---                       Z12.11, Encounter for screening for malignant neoplasm                        of colon                       K63.3, Ulcer of intestine CPT copyright 2016 American Medical Association. All rights reserved. The codes documented in this report are preliminary and upon coder review may  be revised to meet current compliance requirements. [name] MD, MD 09/20/2015 10:12:52 AM This report has been signed electronically. Number of Addenda: 0 Note Initiated On: 09/20/2015 9:49 AM Scope  Withdrawal Time: 0 hours 12 minutes 34 seconds  Total Procedure Duration: 0 hours 15 minutes 57 seconds       [facility]

## 2015-09-20 NOTE — Anesthesia Postprocedure Evaluation (Signed)
Anesthesia Post Note  Patient: Kenneth Collins  Procedure(s) Performed: Procedure(s) (LRB): COLONOSCOPY WITH PROPOFOL (N/A)  Patient location during evaluation: Endoscopy Anesthesia Type: General Level of consciousness: awake and alert Pain management: pain level controlled Vital Signs Assessment: post-procedure vital signs reviewed and stable Respiratory status: spontaneous breathing and respiratory function stable Cardiovascular status: stable Anesthetic complications: no    Last Vitals:  Vitals:   09/20/15 1024 09/20/15 1054  BP: (!) 116/54 134/62  Pulse: (!) 57 (!) 45  Resp: 17 18  Temp:      Last Pain:  Vitals:   09/20/15 1014  TempSrc: Axillary                 Carreen Milius K

## 2015-09-20 NOTE — Anesthesia Procedure Notes (Signed)
Performed by: Dreya Buhrman Pre-anesthesia Checklist: Patient identified, Emergency Drugs available, Suction available, Patient being monitored and Timeout performed Patient Re-evaluated:Patient Re-evaluated prior to inductionOxygen Delivery Method: Nasal cannula Intubation Type: IV induction       

## 2015-09-20 NOTE — Transfer of Care (Signed)
Immediate Anesthesia Transfer of Care Note  Patient: Kenneth Collins  Procedure(s) Performed: Procedure(s): COLONOSCOPY WITH PROPOFOL (N/A)  Patient Location: PACU  Anesthesia Type:General  Level of Consciousness: sedated  Airway & Oxygen Therapy: Patient Spontanous Breathing and Patient connected to nasal cannula oxygen  Post-op Assessment: Report given to RN and Post -op Vital signs reviewed and stable  Post vital signs: Reviewed and stable  Last Vitals:  Vitals:   09/20/15 0936  BP: (!) 155/69  Pulse: 60  Resp: 19  Temp: (!) 35.8 C    Last Pain:  Vitals:   09/20/15 0936  TempSrc: Tympanic         Complications: No apparent anesthesia complications

## 2015-09-20 NOTE — H&P (Signed)
  Kenneth Lame, MD [facility] [address], [place], [place] [address] Phone: [phone] Fax : [phone]  Primary Care Physician:  Vernie Murders, PA Primary Gastroenterologist:  Dr. Allen Norris  Pre-Procedure History & Physical: HPI:  Kenneth Collins is a 70 y.o. male is here for a screening colonoscopy.   No past medical history on file.  Past Surgical History:  Procedure Laterality Date  . MICRODISCECTOMY LUMBAR  03/17/2013  . VASECTOMY      Prior to Admission medications   Medication Sig Start Date End Date Taking? Authorizing Provider  Ascorbic Acid (VITAMIN C) 500 MG CAPS  05/30/14  Yes Historical Provider, MD  aspirin 81 MG tablet Take by mouth.   Yes Historical Provider, MD  atenolol-chlorthalidone (TENORETIC) 50-25 MG tablet TAKE 1 TABLET EVERY DAY 12/02/14  Yes Vickki Muff Chrismon, PA  cholecalciferol (VITAMIN D) 1000 UNITS tablet  04/30/14  Yes Historical Provider, MD  co-enzyme Q-10 30 MG capsule  05/30/14  Yes Historical Provider, MD  MULTIPLE VITAMIN PO Take 1 tablet by mouth daily. 10/13/09  Yes Historical Provider, MD  NAPROXEN SODIUM PO Reported on 07/15/2015 03/31/14  Yes Historical Provider, MD  ciprofloxacin (CIPRO) 500 MG tablet Take 1 tablet (500 mg total) by mouth 2 (two) times daily. Patient not taking: Reported on 09/20/2015 09/01/15   Vickki Muff Chrismon, PA  metroNIDAZOLE (FLAGYL) 500 MG tablet Take 1 tablet (500 mg total) by mouth 2 (two) times daily. Patient not taking: Reported on 09/20/2015 09/07/15   Vickki Muff Chrismon, PA    Allergies as of 07/15/2015  . (No Known Allergies)    Family History  Problem Relation Age of Onset  . Heart disease Father   . Pneumonia Mother   . Cancer Maternal Uncle     Social History   Social History  . Marital status: Married    Spouse name: N/A  . Number of children: N/A  . Years of education: N/A   Occupational History  . Not on file.   Social History Main Topics  . Smoking status: Former Smoker    Types:  Cigarettes  . Smokeless tobacco: Not on file  . Alcohol use 0.0 oz/week     Comment: 1-2 drinks a month.   . Drug use: No  . Sexual activity: Not on file   Other Topics Concern  . Not on file   Social History Narrative  . No narrative on file    Review of Systems: See HPI, otherwise negative ROS  Physical Exam: There were no vitals taken for this visit. General:   Alert,  pleasant and cooperative in NAD Head:  Normocephalic and atraumatic. Neck:  Supple; no masses or thyromegaly. Lungs:  Clear throughout to auscultation.    Heart:  Regular rate and rhythm. Abdomen:  Soft, nontender and nondistended. Normal bowel sounds, without guarding, and without rebound.   Neurologic:  Alert and  oriented x4;  grossly normal neurologically.  Impression/Plan: [name] is now here to undergo a screening colonoscopy.  Risks, benefits, and alternatives regarding colonoscopy have been reviewed with the patient.  Questions have been answered.  All parties agreeable.

## 2015-09-20 NOTE — Anesthesia Preprocedure Evaluation (Signed)
Anesthesia Evaluation  Patient identified by MRN, date of birth, ID band Patient awake    Reviewed: Allergy & Precautions, NPO status , Patient's Chart, lab work & pertinent test results  History of Anesthesia Complications Negative for: history of anesthetic complications  Airway Mallampati: II       Dental   Pulmonary neg pulmonary ROS, former smoker,           Cardiovascular hypertension, Pt. on medications and Pt. on home beta blockers      Neuro/Psych negative neurological ROS     GI/Hepatic negative GI ROS, Neg liver ROS,   Endo/Other  negative endocrine ROS  Renal/GU negative Renal ROS     Musculoskeletal   Abdominal   Peds  Hematology negative hematology ROS (+)   Anesthesia Other Findings   Reproductive/Obstetrics                             Anesthesia Physical Anesthesia Plan  ASA: II  Anesthesia Plan: General   Post-op Pain Management:    Induction: Intravenous  Airway Management Planned: Nasal Cannula  Additional Equipment:   Intra-op Plan:   Post-operative Plan:   Informed Consent: I have reviewed the patients History and Physical, chart, labs and discussed the procedure including the risks, benefits and alternatives for the proposed anesthesia with the patient or authorized representative who has indicated his/her understanding and acceptance.     Plan Discussed with:   Anesthesia Plan Comments:         Anesthesia Quick Evaluation

## 2015-09-21 ENCOUNTER — Encounter: Payer: Self-pay | Admitting: Gastroenterology

## 2015-09-21 LAB — SURGICAL PATHOLOGY

## 2015-09-22 ENCOUNTER — Encounter: Payer: Self-pay | Admitting: Gastroenterology

## 2015-10-11 ENCOUNTER — Telehealth: Payer: Self-pay | Admitting: Family Medicine

## 2015-10-11 NOTE — Telephone Encounter (Signed)
Called Pt to schedule AWV with [facility] and CPE with PCP for 10/ 20 -knb

## 2015-11-10 ENCOUNTER — Telehealth: Payer: Self-pay | Admitting: Family Medicine

## 2015-11-10 NOTE — Telephone Encounter (Signed)
Called Pt to schedule AWV with [facility] - knb °

## 2016-02-02 ENCOUNTER — Other Ambulatory Visit: Payer: Self-pay | Admitting: Family Medicine

## 2016-06-06 DIAGNOSIS — H2513 Age-related nuclear cataract, bilateral: Secondary | ICD-10-CM | POA: Diagnosis not present

## 2016-06-06 DIAGNOSIS — H25013 Cortical age-related cataract, bilateral: Secondary | ICD-10-CM | POA: Diagnosis not present

## 2016-06-06 DIAGNOSIS — H524 Presbyopia: Secondary | ICD-10-CM | POA: Diagnosis not present

## 2016-07-17 ENCOUNTER — Ambulatory Visit (INDEPENDENT_AMBULATORY_CARE_PROVIDER_SITE_OTHER): Payer: Medicare HMO | Admitting: Family Medicine

## 2016-07-17 ENCOUNTER — Ambulatory Visit (INDEPENDENT_AMBULATORY_CARE_PROVIDER_SITE_OTHER): Payer: Medicare HMO

## 2016-07-17 ENCOUNTER — Encounter: Payer: Self-pay | Admitting: Family Medicine

## 2016-07-17 VITALS — BP 158/74 | HR 60 | Temp 99.2°F | Ht 66.0 in | Wt 186.4 lb

## 2016-07-17 DIAGNOSIS — I10 Essential (primary) hypertension: Secondary | ICD-10-CM

## 2016-07-17 DIAGNOSIS — Z0000 Encounter for general adult medical examination without abnormal findings: Secondary | ICD-10-CM

## 2016-07-17 NOTE — Progress Notes (Signed)
Subjective:   Kenneth Collins is a 71 y.o. male who presents for Medicare Annual/Subsequent preventive examination.  Review of Systems:  N/A  Cardiac Risk Factors include: advanced age (>57men, >32 women);hypertension;male gender;obesity (BMI >30kg/m2)     Objective:    Vitals: BP (!) 158/74 (BP Location: Left Arm)   Pulse 60   Temp 99.2 F (37.3 C) (Oral)   Ht 5\' 6"  (1.676 m)   Wt 186 lb 6.4 oz (84.6 kg)   BMI 30.09 kg/m   Body mass index is 30.09 kg/m.  Tobacco History  Smoking Status  . Former Smoker  . Types: Cigarettes  Smokeless Tobacco  . Never Used    Comment: > 50 years ago     Counseling given: Not Answered   Past Medical History:  Diagnosis Date  . Hypertension    Past Surgical History:  Procedure Laterality Date  . COLONOSCOPY WITH PROPOFOL N/A 09/20/2015   Procedure: COLONOSCOPY WITH PROPOFOL;  Surgeon: [name], MD;  Location: [facility] ENDOSCOPY;  Service: Endoscopy;  Laterality: N/A;  . MICRODISCECTOMY LUMBAR  03/17/2013  . VASECTOMY     Family History  Problem Relation Age of Onset  . Heart disease Father   . Pneumonia Mother   . Cancer Maternal Uncle    History  Sexual Activity  . Sexual activity: Not on file    Outpatient Encounter Prescriptions as of 07/17/2016  Medication Sig  . Ascorbic Acid (VITAMIN C) 500 MG CAPS Take 500 mg by mouth daily.   Marland Kitchen atenolol-chlorthalidone (TENORETIC) 50-25 MG tablet TAKE 1 TABLET EVERY DAY  . cholecalciferol (VITAMIN D) 1000 UNITS tablet Take 1,000 Units by mouth daily.   Marland Kitchen co-enzyme Q-10 30 MG capsule Take 30 mg by mouth daily.   . Creatine POWD Take 500 mg by mouth.  . MULTIPLE VITAMIN PO Take 1 tablet by mouth daily.  Marland Kitchen NAPROXEN SODIUM PO daily as needed. Reported on 07/15/2015  . [DISCONTINUED] aspirin 81 MG tablet Take by mouth.  . [DISCONTINUED] ciprofloxacin (CIPRO) 500 MG tablet Take 1 tablet (500 mg total) by mouth 2 (two) times daily. (Patient not taking: Reported on 09/20/2015)  .  [DISCONTINUED] metroNIDAZOLE (FLAGYL) 500 MG tablet Take 1 tablet (500 mg total) by mouth 2 (two) times daily. (Patient not taking: Reported on 09/20/2015)   No facility-administered encounter medications on file as of 07/17/2016.     Activities of Daily Living In your present state of health, do you have any difficulty performing the following activities: 07/17/2016  Hearing? N  Vision? N  Difficulty concentrating or making decisions? N  Walking or climbing stairs? N  Dressing or bathing? N  Doing errands, shopping? N  Preparing Food and eating ? N  Using the Toilet? N  In the past six months, have you accidently leaked urine? N  Do you have problems with loss of bowel control? N  Managing your Medications? N  Managing your Finances? N  Housekeeping or managing your Housekeeping? N  Some recent data might be hidden    Patient Care Team: Chrismon, Vickki Muff, PA as PCP - General (Physician Assistant)   Assessment:     Exercise Activities and Dietary recommendations Current Exercise Habits: Home exercise routine, Type of exercise: walking;treadmill;strength training/weights, Time (Minutes): > 60 (1.5 hours), Frequency (Times/Week): 3, Weekly Exercise (Minutes/Week): 0, Intensity: Mild, Exercise limited by: None identified  Goals    . Increase water intake          Recommend increasing water intake to 4-6  glasses a day.      Fall Risk Fall Risk  07/17/2016 07/12/2015 06/29/2014  Falls in the past year? No No No   Depression Screen PHQ 2/9 Scores 07/17/2016 07/17/2016 07/12/2015 06/29/2014  PHQ - 2 Score 0 0 0 0  PHQ- 9 Score 0 - - -    Cognitive Function        Immunization History  Administered Date(s) Administered  . Influenza Split 10/09/2011  . Pneumococcal Polysaccharide-23 10/09/2011  . Tdap 10/19/2010   Screening Tests Health Maintenance  Topic Date Due  . PNA vac Low Risk Adult (2 of 2 - PCV13) 10/08/2012  . INFLUENZA VACCINE  08/01/2016  . TETANUS/TDAP   10/18/2020  . COLONOSCOPY  09/19/2025  . Hepatitis C Screening  Completed      Plan:  I have personally reviewed and addressed the Medicare Annual Wellness questionnaire and have noted the following in the patient's chart:  A. Medical and social history B. Use of alcohol, tobacco or illicit drugs  C. Current medications and supplements D. Functional ability and status E.  Nutritional status F.  Physical activity G. Advance directives H. List of other physicians I.  Hospitalizations, surgeries, and ER visits in previous 12 months J.  [place] such as hearing and vision if needed, cognitive and depression L. Referrals and appointments - none  In addition, I have reviewed and discussed with patient certain preventive protocols, quality metrics, and best practice recommendations. A written personalized care plan for preventive services as well as general preventive health recommendations were provided to patient.  See attached scanned questionnaire for additional information.   Signed,  Fabio Neighbors, LPN Nurse Health Advisor   MD Recommendations: Pt declined Prevnar 13 vaccine today.

## 2016-07-17 NOTE — Patient Instructions (Signed)
Mr. Kenneth Collins , Thank you for taking time to come for your Medicare Wellness Visit. I appreciate your ongoing commitment to your health goals. Please review the following plan we discussed and let me know if I can assist you in the future.   Screening recommendations/referrals: Colonoscopy: completed 09/20/15, due 09/2025 Recommended yearly ophthalmology/optometry visit for glaucoma screening and checkup Recommended yearly dental visit for hygiene and checkup  Vaccinations: Influenza vaccine: due 09/2016 Pneumococcal vaccine: Pneumovax 23 given 10/09/11, declined Prevnar 13 today Tdap vaccine: completed 10/19/10, due 10/2020 Shingles vaccine: declined   Advanced directives: Please bring a copy of your POA (Power of Byars) and/or Living Will to your next appointment. Pt to complete this first.   Conditions/risks identified: Recommend increasing water intake to 4-6 glasses a day.  Next appointment: None, need to schedule 1 year AWV  Preventive Care 45 Years and Older, Male Preventive care refers to lifestyle choices and visits with your health care provider that can promote health and wellness. What does preventive care include?  A yearly physical exam. This is also called an annual well check.  Dental exams once or twice a year.  Routine eye exams. Ask your health care provider how often you should have your eyes checked.  Personal lifestyle choices, including:  Daily care of your teeth and gums.  Regular physical activity.  Eating a healthy diet.  Avoiding tobacco and drug use.  Limiting alcohol use.  Practicing safe sex.  Taking low doses of aspirin every day.  Taking vitamin and mineral supplements as recommended by your health care provider. What happens during an annual well check? The services and screenings done by your health care provider during your annual well check will depend on your age, overall health, lifestyle risk factors, and family history of  disease. Counseling  Your health care provider may ask you questions about your:  Alcohol use.  Tobacco use.  Drug use.  Emotional well-being.  Home and relationship well-being.  Sexual activity.  Eating habits.  History of falls.  Memory and ability to understand (cognition).  Work and work Statistician. Screening  You may have the following tests or measurements:  Height, weight, and BMI.  Blood pressure.  Lipid and cholesterol levels. These may be checked every 5 years, or more frequently if you are over 31 years old.  Skin check.  Lung cancer screening. You may have this screening every year starting at age 68 if you have a 30-pack-year history of smoking and currently smoke or have quit within the past 15 years.  Fecal occult blood test (FOBT) of the stool. You may have this test every year starting at age 39.  Flexible sigmoidoscopy or colonoscopy. You may have a sigmoidoscopy every 5 years or a colonoscopy every 10 years starting at age 44.  Prostate cancer screening. Recommendations will vary depending on your family history and other risks.  Hepatitis C blood test.  Hepatitis B blood test.  Sexually transmitted disease (STD) testing.  Diabetes screening. This is done by checking your blood sugar (glucose) after you have not eaten for a while (fasting). You may have this done every 1-3 years.  Abdominal aortic aneurysm (AAA) screening. You may need this if you are a current or former smoker.  Osteoporosis. You may be screened starting at age 56 if you are at high risk. Talk with your health care provider about your test results, treatment options, and if necessary, the need for more tests. Vaccines  Your health care provider may  recommend certain vaccines, such as:  Influenza vaccine. This is recommended every year.  Tetanus, diphtheria, and acellular pertussis (Tdap, Td) vaccine. You may need a Td booster every 10 years.  Zoster vaccine. You may  need this after age 36.  Pneumococcal 13-valent conjugate (PCV13) vaccine. One dose is recommended after age 63.  Pneumococcal polysaccharide (PPSV23) vaccine. One dose is recommended after age 2. Talk to your health care provider about which screenings and vaccines you need and how often you need them. This information is not intended to replace advice given to you by your health care provider. Make sure you discuss any questions you have with your health care provider. Document Released: 01/14/2015 Document Revised: 09/07/2015 Document Reviewed: 10/19/2014 Elsevier Interactive Patient Education  2017 [place] Prevention in the Home Falls can cause injuries. They can happen to people of all ages. There are many things you can do to make your home safe and to help prevent falls. What can I do on the outside of my home?  Regularly fix the edges of walkways and driveways and fix any cracks.  Remove anything that might make you trip as you walk through a door, such as a raised step or threshold.  Trim any bushes or trees on the path to your home.  Use bright outdoor lighting.  Clear any walking paths of anything that might make someone trip, such as rocks or tools.  Regularly check to see if handrails are loose or broken. Make sure that both sides of any steps have handrails.  Any raised decks and porches should have guardrails on the edges.  Have any leaves, snow, or ice cleared regularly.  Use sand or salt on walking paths during winter.  Clean up any spills in your garage right away. This includes oil or grease spills. What can I do in the bathroom?  Use night lights.  Install grab bars by the toilet and in the tub and shower. Do not use towel bars as grab bars.  Use non-skid mats or decals in the tub or shower.  If you need to sit down in the shower, use a plastic, non-slip stool.  Keep the floor dry. Clean up any water that spills on the floor as soon as it  happens.  Remove soap buildup in the tub or shower regularly.  Attach bath mats securely with double-sided non-slip rug tape.  Do not have throw rugs and other things on the floor that can make you trip. What can I do in the bedroom?  Use night lights.  Make sure that you have a light by your bed that is easy to reach.  Do not use any sheets or blankets that are too big for your bed. They should not hang down onto the floor.  Have a firm chair that has side arms. You can use this for support while you get dressed.  Do not have throw rugs and other things on the floor that can make you trip. What can I do in the kitchen?  Clean up any spills right away.  Avoid walking on wet floors.  Keep items that you use a lot in easy-to-reach places.  If you need to reach something above you, use a strong step stool that has a grab bar.  Keep electrical cords out of the way.  Do not use floor polish or wax that makes floors slippery. If you must use wax, use non-skid floor wax.  Do not have throw rugs and  other things on the floor that can make you trip. What can I do with my stairs?  Do not leave any items on the stairs.  Make sure that there are handrails on both sides of the stairs and use them. Fix handrails that are broken or loose. Make sure that handrails are as long as the stairways.  Check any carpeting to make sure that it is firmly attached to the stairs. Fix any carpet that is loose or worn.  Avoid having throw rugs at the top or bottom of the stairs. If you do have throw rugs, attach them to the floor with carpet tape.  Make sure that you have a light switch at the top of the stairs and the bottom of the stairs. If you do not have them, ask someone to add them for you. What else can I do to help prevent falls?  Wear shoes that:  Do not have high heels.  Have rubber bottoms.  Are comfortable and fit you well.  Are closed at the toe. Do not wear sandals.  If you  use a stepladder:  Make sure that it is fully opened. Do not climb a closed stepladder.  Make sure that both sides of the stepladder are locked into place.  Ask someone to hold it for you, if possible.  Clearly mark and make sure that you can see:  Any grab bars or handrails.  First and last steps.  Where the edge of each step is.  Use tools that help you move around (mobility aids) if they are needed. These include:  Canes.  Walkers.  Scooters.  Crutches.  Turn on the lights when you go into a dark area. Replace any light bulbs as soon as they burn out.  Set up your furniture so you have a clear path. Avoid moving your furniture around.  If any of your floors are uneven, fix them.  If there are any pets around you, be aware of where they are.  Review your medicines with your doctor. Some medicines can make you feel dizzy. This can increase your chance of falling. Ask your doctor what other things that you can do to help prevent falls. This information is not intended to replace advice given to you by your health care provider. Make sure you discuss any questions you have with your health care provider. Document Released: 10/14/2008 Document Revised: 05/26/2015 Document Reviewed: 01/22/2014 Elsevier Interactive Patient Education  2017 [employer].

## 2016-07-17 NOTE — Progress Notes (Addendum)
Patient: Kenneth Collins, Male    DOB: [birth year], 71 y.o.   MRN: [id number] Visit Date: 07/17/2016  Today's Provider: Vernie Murders, PA   Chief Complaint  Patient presents with  . Annual Exam   Subjective:    Annual physical exam Kenneth Collins is a 71 y.o. male who presents today for health maintenance and complete physical. He feels well. He reports exercising 3 times per week. He reports he is sleeping well.  ----------------------------------------------------------------- Colonoscopy: 09/20/2015 Tetanus: 10/19/2010  Review of Systems  Constitutional: Negative.   HENT: Negative.   Eyes: Negative.   Respiratory: Negative.   Cardiovascular: Negative.   Gastrointestinal: Negative.   Endocrine: Negative.   Genitourinary: Negative.   Musculoskeletal: Negative.   Skin: Negative.   Allergic/Immunologic: Negative.   Neurological: Negative.   Hematological: Negative.   Psychiatric/Behavioral: Negative.     Social History      He  reports that he has quit smoking. His smoking use included Cigarettes. He has never used smokeless tobacco. He reports that he drinks alcohol. He reports that he does not use drugs.       Social History   Social History  . Marital status: Married    Spouse name: N/A  . Number of children: N/A  . Years of education: N/A   Social History Main Topics  . Smoking status: Former Smoker    Types: Cigarettes  . Smokeless tobacco: Never Used     Comment: > 50 years ago  . Alcohol use 0.0 oz/week     Comment: 1-2 drinks a month.   . Drug use: No  . Sexual activity: Not Asked   Other Topics Concern  . None   Social History Narrative  . None    Past Medical History:  Diagnosis Date  . Hypertension    Patient Active Problem List   Diagnosis Date Noted  . Special screening for malignant neoplasms, colon   . Ulceration of intestine   . Chicken pox 04/16/2014  . Displacement of lumbar intervertebral disc without myelopathy 04/16/2014    . Personal history of tobacco use, presenting hazards to health 04/16/2014  . Received influenza vaccination at hospital 04/16/2014  . LBP (low back pain) 04/16/2014  . Need for vaccination 04/16/2014  . Pneumococcal vaccination given 04/16/2014  . Special screening for malignant neoplasm of prostate 04/16/2014  . Screening for ischemic heart disease 04/16/2014  . Basal cell papilloma 05/07/2008  . Screening for lipoid disorders 05/07/2008  . Acute conjunctivitis 02/05/2007  . Acute upper respiratory infection 10/17/2006  . Prostatitis 10/17/2006  . Difficult or painful urination 10/08/2006  . Febrile 10/08/2006  . Routine general medical examination at a health care facility 10/22/2005  . Blood in the urine 10/22/2005  . Tobacco use 04/20/2005  . Essential (primary) hypertension 10/20/2004   Past Surgical History:  Procedure Laterality Date  . COLONOSCOPY WITH PROPOFOL N/A 09/20/2015   Procedure: COLONOSCOPY WITH PROPOFOL;  Surgeon: [name], MD;  Location: [facility] ENDOSCOPY;  Service: Endoscopy;  Laterality: N/A;  . MICRODISCECTOMY LUMBAR  03/17/2013  . VASECTOMY     Family History        Family Status  Relation Status  . Father Deceased  . Mother Deceased  . Sister Alive  . Mat Uncle (Not Specified)        His family history includes Cancer in his maternal uncle; Heart disease in his father; Pneumonia in his mother.     No Known Allergies  Current Outpatient Prescriptions:  .  Ascorbic Acid (VITAMIN C) 500 MG CAPS, Take 500 mg by mouth daily. , Disp: , Rfl:  .  atenolol-chlorthalidone (TENORETIC) 50-25 MG tablet, TAKE 1 TABLET EVERY DAY, Disp: 90 tablet, Rfl: 3 .  cholecalciferol (VITAMIN D) 1000 UNITS tablet, Take 1,000 Units by mouth daily. , Disp: , Rfl:  .  co-enzyme Q-10 30 MG capsule, Take 30 mg by mouth daily. , Disp: , Rfl:  .  Creatine POWD, Take 500 mg by mouth., Disp: , Rfl:  .  MULTIPLE VITAMIN PO, Take 1 tablet by mouth daily., Disp: , Rfl:  .   NAPROXEN SODIUM PO, daily as needed. Reported on 07/15/2015, Disp: , Rfl:    Patient Care Team: Margo Common, PA as PCP - General (Physician Assistant)      Objective:   Vitals: BP (!) 158/74   Pulse 60   Temp 99.2 F (37.3 C) (Oral)   Ht 5\' 6"  (1.676 m)   Wt 186 lb 6.4 oz (84.6 kg)   BMI 30.09 kg/m   Wt Readings from Last 3 Encounters:  07/17/16 186 lb 6.4 oz (84.6 kg)  07/17/16 186 lb 6.4 oz (84.6 kg)  09/20/15 185 lb (83.9 kg)    Vitals:   07/17/16 1021  BP: (!) 158/74  Pulse: 60  Temp: 99.2 F (37.3 C)  TempSrc: Oral  Weight: 186 lb 6.4 oz (84.6 kg)  Height: 5\' 6"  (1.676 m)    Physical Exam  Constitutional: He is oriented to person, place, and time. He appears well-developed and well-nourished.  HENT:  Head: Normocephalic and atraumatic.  Right Ear: External ear normal.  Left Ear: External ear normal.  Nose: Nose normal.  Mouth/Throat: Oropharynx is clear and moist.  Eyes: Pupils are equal, round, and reactive to light. Conjunctivae and EOM are normal. Right eye exhibits no discharge.  Neck: Normal range of motion. Neck supple. No tracheal deviation present. No thyromegaly present.  Cardiovascular: Normal rate, regular rhythm, normal heart sounds and intact distal pulses.   No murmur heard. Pulmonary/Chest: Effort normal and breath sounds normal. No respiratory distress. He has no wheezes. He has no rales. He exhibits no tenderness.  Abdominal: Soft. He exhibits no distension and no mass. There is no tenderness. There is no rebound and no guarding.  Musculoskeletal: Normal range of motion. He exhibits no edema or tenderness.  Lymphadenopathy:    He has no cervical adenopathy.  Neurological: He is alert and oriented to person, place, and time. He has normal reflexes. No cranial nerve deficit. He exhibits normal muscle tone. Coordination normal.  Skin: Skin is warm and dry. No rash noted. No erythema.  Psychiatric: He has a normal mood and affect. His behavior  is normal. Judgment and thought content normal.    Depression Screen PHQ 2/9 Scores 07/17/2016 07/17/2016 07/12/2015 06/29/2014  PHQ - 2 Score 0 0 0 0  PHQ- 9 Score 0 - - -    Assessment & Plan:     Routine Health Maintenance and Physical Exam  Exercise Activities and Dietary recommendations Goals    . Increase water intake          Recommend increasing water intake to 4-6 glasses a day.       Immunization History  Administered Date(s) Administered  . Influenza Split 10/09/2011  . Pneumococcal Polysaccharide-23 10/09/2011  . Tdap 10/19/2010    Health Maintenance  Topic Date Due  . PNA vac Low Risk Adult (2 of 2 - PCV13)  07/01/2017 (Originally 10/08/2012)  . INFLUENZA VACCINE  08/01/2016  . TETANUS/TDAP  10/18/2020  . COLONOSCOPY  09/19/2025  . Hepatitis C Screening  Completed     Discussed health benefits of physical activity, and encouraged him to engage in regular exercise appropriate for his age and condition.    -------------------------------------------------------------------- 1. Annual physical exam Good general health. Some varicose veins of the right lower leg. No significant arthritic changes. No nocturia, chest pains, palpitations, dyspnea, dyspepsia or vision changes. Refuses Prevnar-13 today. All other immunizations up to date. Had colonoscopy Sept. 2017 showing only diverticulosis without inflammation or ulcerations. Reviewed Nurse Health Advisor's note. Agree with documentation and recommendations. - CBC with Differential/Platelet; Future - Comprehensive metabolic panel; Future - Lipid panel; Future - TSH; Future  2. Essential (primary) hypertension Tolerating Tenoretic daily without side effects. Essentially stable control of BP. Will get follow up labs in a couple weeks and recheck pending reports. - CBC with Differential/Platelet; Future - Comprehensive metabolic panel; Future - Lipid panel; Future - TSH; Future    Vernie Murders, PA   [facility]

## 2016-09-11 ENCOUNTER — Other Ambulatory Visit: Payer: Self-pay

## 2016-09-11 DIAGNOSIS — Z0000 Encounter for general adult medical examination without abnormal findings: Secondary | ICD-10-CM | POA: Diagnosis not present

## 2016-09-11 DIAGNOSIS — I10 Essential (primary) hypertension: Secondary | ICD-10-CM

## 2016-09-11 LAB — COMPREHENSIVE METABOLIC PANEL
AG RATIO: 1.3 (calc) (ref 1.0–2.5)
ALT: 23 U/L (ref 9–46)
AST: 28 U/L (ref 10–35)
Albumin: 3.8 g/dL (ref 3.6–5.1)
Alkaline phosphatase (APISO): 60 U/L (ref 40–115)
BUN / CREAT RATIO: 19 (calc) (ref 6–22)
BUN: 34 mg/dL — ABNORMAL HIGH (ref 7–25)
CO2: 27 mmol/L (ref 20–32)
CREATININE: 1.76 mg/dL — AB (ref 0.70–1.18)
Calcium: 9.1 mg/dL (ref 8.6–10.3)
Chloride: 103 mmol/L (ref 98–110)
GLUCOSE: 76 mg/dL (ref 65–99)
Globulin: 2.9 g/dL (calc) (ref 1.9–3.7)
Potassium: 3.9 mmol/L (ref 3.5–5.3)
SODIUM: 140 mmol/L (ref 135–146)
TOTAL PROTEIN: 6.7 g/dL (ref 6.1–8.1)
Total Bilirubin: 0.7 mg/dL (ref 0.2–1.2)

## 2016-09-11 LAB — CBC WITH DIFFERENTIAL/PLATELET
BASOS ABS: 23 {cells}/uL (ref 0–200)
Basophils Relative: 0.3 %
EOS PCT: 1.7 %
Eosinophils Absolute: 133 cells/uL (ref 15–500)
HCT: 43.3 % (ref 38.5–50.0)
HEMOGLOBIN: 15 g/dL (ref 13.2–17.1)
Lymphs Abs: 2668 cells/uL (ref 850–3900)
MCH: 31 pg (ref 27.0–33.0)
MCHC: 34.6 g/dL (ref 32.0–36.0)
MCV: 89.5 fL (ref 80.0–100.0)
MONOS PCT: 10.3 %
MPV: 10.3 fL (ref 7.5–12.5)
NEUTROS ABS: 4173 {cells}/uL (ref 1500–7800)
Neutrophils Relative %: 53.5 %
PLATELETS: 229 10*3/uL (ref 140–400)
RBC: 4.84 10*6/uL (ref 4.20–5.80)
RDW: 12.8 % (ref 11.0–15.0)
TOTAL LYMPHOCYTE: 34.2 %
WBC mixed population: 803 cells/uL (ref 200–950)
WBC: 7.8 10*3/uL (ref 3.8–10.8)

## 2016-09-11 LAB — LIPID PANEL
CHOL/HDL RATIO: 3.6 (calc) (ref <5.0)
Cholesterol: 166 mg/dL (ref <200)
HDL: 46 mg/dL (ref >40)
LDL CHOLESTEROL (CALC): 101 mg/dL — AB
NON-HDL CHOLESTEROL (CALC): 120 mg/dL (ref <130)
TRIGLYCERIDES: 96 mg/dL (ref <150)

## 2016-09-11 LAB — TSH: TSH: 2.62 m[IU]/L (ref 0.40–4.50)

## 2016-09-12 ENCOUNTER — Telehealth: Payer: Self-pay

## 2016-09-12 NOTE — Telephone Encounter (Signed)
-----   Message from Margo Common, [place] sent at 09/11/2016  6:23 PM EDT ----- Awaiting CBC and TSH results. CMP shows worsening of kidney function again. Normal cholesterol. Don't take any creatine powder. It could cause water retention and kidney strain increasing the kidney function tests. Recheck kidney function in 2 months. If still elevated at that time, will need nephrology referral.

## 2016-09-12 NOTE — Telephone Encounter (Signed)
Patient has been advised

## 2016-11-06 ENCOUNTER — Encounter: Payer: Self-pay | Admitting: Family Medicine

## 2016-11-06 ENCOUNTER — Ambulatory Visit (INDEPENDENT_AMBULATORY_CARE_PROVIDER_SITE_OTHER): Payer: Medicare HMO | Admitting: Family Medicine

## 2016-11-06 VITALS — BP 136/68 | HR 49 | Temp 97.9°F | Wt 183.2 lb

## 2016-11-06 DIAGNOSIS — I10 Essential (primary) hypertension: Secondary | ICD-10-CM | POA: Diagnosis not present

## 2016-11-06 DIAGNOSIS — R7989 Other specified abnormal findings of blood chemistry: Secondary | ICD-10-CM | POA: Diagnosis not present

## 2016-11-06 LAB — COMPLETE METABOLIC PANEL WITH GFR
AG Ratio: 1.3 (calc) (ref 1.0–2.5)
ALKALINE PHOSPHATASE (APISO): 58 U/L (ref 40–115)
ALT: 20 U/L (ref 9–46)
AST: 21 U/L (ref 10–35)
Albumin: 3.9 g/dL (ref 3.6–5.1)
BUN/Creatinine Ratio: 25 (calc) — ABNORMAL HIGH (ref 6–22)
BUN: 45 mg/dL — AB (ref 7–25)
CO2: 29 mmol/L (ref 20–32)
CREATININE: 1.77 mg/dL — AB (ref 0.70–1.18)
Calcium: 9.2 mg/dL (ref 8.6–10.3)
Chloride: 105 mmol/L (ref 98–110)
GFR, Est African American: 43 mL/min/{1.73_m2} — ABNORMAL LOW (ref >=60)
GFR, Est Non African American: 37 mL/min/{1.73_m2} — ABNORMAL LOW (ref >=60)
GLOBULIN: 3 g/dL (ref 1.9–3.7)
GLUCOSE: 95 mg/dL (ref 65–99)
Potassium: 4.6 mmol/L (ref 3.5–5.3)
SODIUM: 141 mmol/L (ref 135–146)
Total Bilirubin: 0.6 mg/dL (ref 0.2–1.2)
Total Protein: 6.9 g/dL (ref 6.1–8.1)

## 2016-11-06 LAB — CBC WITH DIFFERENTIAL/PLATELET
BASOS ABS: 23 {cells}/uL (ref 0–200)
Basophils Relative: 0.3 %
EOS PCT: 1.7 %
Eosinophils Absolute: 128 cells/uL (ref 15–500)
HCT: 45.4 % (ref 38.5–50.0)
HEMOGLOBIN: 15.7 g/dL (ref 13.2–17.1)
Lymphs Abs: 1808 cells/uL (ref 850–3900)
MCH: 31 pg (ref 27.0–33.0)
MCHC: 34.6 g/dL (ref 32.0–36.0)
MCV: 89.7 fL (ref 80.0–100.0)
MONOS PCT: 8 %
MPV: 10.2 fL (ref 7.5–12.5)
NEUTROS ABS: 4943 {cells}/uL (ref 1500–7800)
Neutrophils Relative %: 65.9 %
PLATELETS: 224 10*3/uL (ref 140–400)
RBC: 5.06 10*6/uL (ref 4.20–5.80)
RDW: 12.5 % (ref 11.0–15.0)
TOTAL LYMPHOCYTE: 24.1 %
WBC mixed population: 600 cells/uL (ref 200–950)
WBC: 7.5 10*3/uL (ref 3.8–10.8)

## 2016-11-06 NOTE — Progress Notes (Signed)
Patient: Kenneth Collins Male    DOB: 04-22-1945   71 y.o.   MRN: [id number] Visit Date: 11/06/2016  Today's Provider: Vernie Murders, PA   Chief Complaint  Patient presents with  . Hypertension  . Follow-up   Subjective:    HPI  Hypertension, follow-up:  BP Readings from Last 3 Encounters:  11/06/16 136/68  07/17/16 (!) 158/74  07/17/16 (!) 158/74    He was last seen for hypertension 4 months ago.  BP at that visit was 158/74. Management changes since that visit include continue medication. He reports good compliance with treatment. He is not having side effects.  He is exercising. He is adherent to low salt diet.   Outside blood pressures are being checked at times. He is experiencing none.  Patient denies chest pain, chest pressure/discomfort, claudication, dyspnea, exertional chest pressure/discomfort, fatigue, irregular heart beat, lower extremity edema, near-syncope, orthopnea, palpitations, paroxysmal nocturnal dyspnea, syncope and tachypnea.   Cardiovascular risk factors include advanced age (older than 69 for men, 68 for women), dyslipidemia, hypertension and male gender.  Use of agents associated with hypertension: none.     Weight trend: stable Wt Readings from Last 3 Encounters:  11/06/16 183 lb 3.2 oz (83.1 kg)  07/17/16 186 lb 6.4 oz (84.6 kg)  07/17/16 186 lb 6.4 oz (84.6 kg)   Current diet: high protein, low carb  ------------------------------------------------------------------------ Patient had labs checked on 09/11/2016. Results showed kidney strain. Patient advised to discontinue creatine powder and recheck labs today.   Past Medical History:  Diagnosis Date  . Hypertension    Past Surgical History:  Procedure Laterality Date  . MICRODISCECTOMY LUMBAR  03/17/2013  . VASECTOMY     Family History  Problem Relation Age of Onset  . Heart disease Father   . Pneumonia Mother   . Cancer Maternal Uncle    No Known Allergies   Current  Outpatient Medications:  .  Ascorbic Acid (VITAMIN C) 500 MG CAPS, Take 500 mg by mouth daily. , Disp: , Rfl:  .  atenolol-chlorthalidone (TENORETIC) 50-25 MG tablet, TAKE 1 TABLET EVERY DAY, Disp: 90 tablet, Rfl: 3 .  cholecalciferol (VITAMIN D) 1000 UNITS tablet, Take 1,000 Units by mouth daily. , Disp: , Rfl:  .  co-enzyme Q-10 30 MG capsule, Take 30 mg by mouth daily. , Disp: , Rfl:  .  Creatine POWD, Take 500 mg by mouth., Disp: , Rfl:  .  MULTIPLE VITAMIN PO, Take 1 tablet by mouth daily., Disp: , Rfl:  .  NAPROXEN SODIUM PO, daily as needed. Reported on 07/15/2015, Disp: , Rfl:   Review of Systems  Constitutional: Negative.   Respiratory: Negative.   Cardiovascular: Negative.     Social History   Tobacco Use  . Smoking status: Former Smoker    Types: Cigarettes  . Smokeless tobacco: Never Used  . Tobacco comment: > 50 years ago  Substance Use Topics  . Alcohol use: Yes    Alcohol/week: 0.0 oz    Comment: 1-2 drinks a month.    Objective:   BP 136/68 (BP Location: Right Arm, Patient Position: Sitting, Cuff Size: Normal)   Pulse (!) 49   Temp 97.9 F (36.6 C) (Oral)   Wt 183 lb 3.2 oz (83.1 kg)   SpO2 98%   BMI 29.57 kg/m    Physical Exam  Constitutional: He is oriented to person, place, and time. He appears well-developed and well-nourished. No distress.  HENT:  Head: Normocephalic and atraumatic.  Right Ear: Hearing normal.  Left Ear: Hearing normal.  Nose: Nose normal.  Eyes: Conjunctivae and lids are normal. Right eye exhibits no discharge. Left eye exhibits no discharge. No scleral icterus.  Neck: Neck supple.  Cardiovascular: Normal rate and regular rhythm.  Pulmonary/Chest: Effort normal and breath sounds normal. No respiratory distress.  Abdominal: Soft. Bowel sounds are normal. He exhibits no mass.  Musculoskeletal: Normal range of motion.  Neurological: He is alert and oriented to person, place, and time.  Skin: Skin is intact. No lesion and no rash  noted.  Psychiatric: He has a normal mood and affect. His speech is normal and behavior is normal. Thought content normal.      Assessment & Plan:     1. Essential (primary) hypertension Improved BP and lost 3 lbs since last office visit. Recheck CBC and CMP to evaluate renal strain seen on last labs in Sept. 2018 with Creatinine 1.76. Recheck pending lab reports. - CBC with Differential/Platelet - Comprehensive metabolic panel  2. Elevated serum creatinine Stopped all use of the Creatine and will recheck CMP with CBC. Feeling well. Continue to drink extra fluids and recheck pending reports.       Vernie Murders, PA  [facility]

## 2016-11-15 ENCOUNTER — Ambulatory Visit: Payer: Medicare HMO | Admitting: Family Medicine

## 2016-11-19 ENCOUNTER — Other Ambulatory Visit: Payer: Self-pay | Admitting: Family Medicine

## 2017-01-25 ENCOUNTER — Encounter: Payer: Self-pay | Admitting: Family Medicine

## 2017-01-25 ENCOUNTER — Ambulatory Visit (INDEPENDENT_AMBULATORY_CARE_PROVIDER_SITE_OTHER): Payer: Medicare HMO | Admitting: Family Medicine

## 2017-01-25 VITALS — BP 124/56 | HR 56 | Temp 98.5°F | Wt 185.8 lb

## 2017-01-25 DIAGNOSIS — I10 Essential (primary) hypertension: Secondary | ICD-10-CM | POA: Diagnosis not present

## 2017-01-25 DIAGNOSIS — R7989 Other specified abnormal findings of blood chemistry: Secondary | ICD-10-CM

## 2017-01-25 DIAGNOSIS — K5733 Diverticulitis of large intestine without perforation or abscess with bleeding: Secondary | ICD-10-CM | POA: Diagnosis not present

## 2017-01-25 MED ORDER — AMOXICILLIN 875 MG PO TABS: 875.0000 mg | ORAL_TABLET | Freq: Two times a day (BID) | ORAL | 0 refills | Status: DC

## 2017-01-25 NOTE — Progress Notes (Signed)
Patient: Kenneth Collins Male    DOB: February 06, 1945   72 y.o.   MRN: [id number] Visit Date: 01/25/2017  Today's Provider: Vernie Murders, PA   Chief Complaint  Patient presents with  . Abdominal Pain   Subjective:    Abdominal Pain  This is a new problem. The current episode started yesterday. The onset quality is sudden. Progression since onset: improved after taking antibiotic last night. The pain is located in the LLQ. The patient is experiencing no pain. Associated symptoms comments: Chills, fever, and tenderness in LLQ. He has tried antibiotics (Patient had a RX he was prescribed previously for Diverticulitis ) for the symptoms. The treatment provided significant relief. ulceration of intestine on colonoscopy in 2017. No blood in stools.   Past Medical History:  Diagnosis Date  . Hypertension    Patient Active Problem List   Diagnosis Date Noted  . Special screening for malignant neoplasms, colon   . Ulceration of intestine   . Chicken pox 04/16/2014  . Displacement of lumbar intervertebral disc without myelopathy 04/16/2014  . Personal history of tobacco use, presenting hazards to health 04/16/2014  . Received influenza vaccination at hospital 04/16/2014  . LBP (low back pain) 04/16/2014  . Need for vaccination 04/16/2014  . Pneumococcal vaccination given 04/16/2014  . Special screening for malignant neoplasm of prostate 04/16/2014  . Screening for ischemic heart disease 04/16/2014  . Basal cell papilloma 05/07/2008  . Screening for lipoid disorders 05/07/2008  . Acute conjunctivitis 02/05/2007  . Acute upper respiratory infection 10/17/2006  . Prostatitis 10/17/2006  . Difficult or painful urination 10/08/2006  . Febrile 10/08/2006  . Routine general medical examination at a health care facility 10/22/2005  . Blood in the urine 10/22/2005  . Tobacco use 04/20/2005  . Essential (primary) hypertension 10/20/2004   Past Surgical History:  Procedure Laterality Date    . COLONOSCOPY WITH PROPOFOL N/A 09/20/2015   Procedure: COLONOSCOPY WITH PROPOFOL;  Surgeon: [name], MD;  Location: [facility] ENDOSCOPY;  Service: Endoscopy;  Laterality: N/A;  . MICRODISCECTOMY LUMBAR  03/17/2013  . VASECTOMY     Family History  Problem Relation Age of Onset  . Heart disease Father   . Pneumonia Mother   . Cancer Maternal Uncle    No Known Allergies  Current Outpatient Medications:  .  Ascorbic Acid (VITAMIN C) 500 MG CAPS, Take 500 mg by mouth daily. , Disp: , Rfl:  .  atenolol-chlorthalidone (TENORETIC) 50-25 MG tablet, TAKE 1 TABLET EVERY DAY, Disp: 90 tablet, Rfl: 3 .  cholecalciferol (VITAMIN D) 1000 UNITS tablet, Take 1,000 Units by mouth daily. , Disp: , Rfl:  .  co-enzyme Q-10 30 MG capsule, Take 30 mg by mouth daily. , Disp: , Rfl:  .  Creatine POWD, Take 500 mg by mouth., Disp: , Rfl:  .  MULTIPLE VITAMIN PO, Take 1 tablet by mouth daily., Disp: , Rfl:  .  NAPROXEN SODIUM PO, daily as needed. Reported on 07/15/2015, Disp: , Rfl:   Review of Systems  Constitutional: Negative.   Respiratory: Negative.   Cardiovascular: Negative.   Gastrointestinal: Positive for abdominal pain.   Social History   Tobacco Use  . Smoking status: Former Smoker    Types: Cigarettes  . Smokeless tobacco: Never Used  . Tobacco comment: > 50 years ago  Substance Use Topics  . Alcohol use: Yes    Alcohol/week: 0.0 oz    Comment: 1-2 drinks a month.    Objective:  BP (!) 124/56 (BP Location: Right Arm, Patient Position: Sitting, Cuff Size: Normal)   Pulse (!) 56   Temp 98.5 F (36.9 C) (Oral)   Wt 185 lb 12.8 oz (84.3 kg)   SpO2 96%   BMI 29.99 kg/m   Physical Exam  Constitutional: He is oriented to person, place, and time. He appears well-developed and well-nourished. No distress.  HENT:  Head: Normocephalic and atraumatic.  Right Ear: Hearing normal.  Left Ear: Hearing normal.  Nose: Nose normal.  Eyes: Conjunctivae and lids are normal. Right eye exhibits no  discharge. Left eye exhibits no discharge. No scleral icterus.  Neck: Neck supple.  Cardiovascular: Normal rate and regular rhythm.  Pulmonary/Chest: Effort normal. No respiratory distress.  Abdominal: Soft. Bowel sounds are normal. He exhibits no distension and no mass. There is no tenderness. There is no rebound and no guarding.  Musculoskeletal: Normal range of motion.  Neurological: He is alert and oriented to person, place, and time.  Skin: Skin is intact. No lesion and no rash noted.  Psychiatric: He has a normal mood and affect. His speech is normal and behavior is normal. Thought content normal.      Assessment & Plan:     1. Diverticulitis large intestine w/o perforation or abscess w/bleeding Had LLQ abdominal pain yesterday with fever and chills that was the same with past diverticulitis. No blood in stools and after taking a dose of his Amoxicillin, no longer having discomfort or chills. No diarrhea, constipation or melena. Has increased fluid intake and gone to a soft bland diet. May slowly go back to regular diet and given refill of the Amoxil to use prn. Recheck prn.  2. Essential (primary) hypertension Well controlled and stable. No chest pains, dyspnea or palpitations. Recheck CBC and CMP 02-05-17. Continues to take Tenoretic 50-25 mg qd without side effects. - CBC with Differential/Platelet; Future - Comprehensive metabolic panel; Future  3. Elevated serum creatinine Feeling well. Creatinine was 1.77 with GFR 37 on 11-06-16. Will schedule for recheck labs 02-05-17 - Comprehensive metabolic panel; Future       Vernie Murders, PA  [facility]

## 2017-02-05 ENCOUNTER — Other Ambulatory Visit: Payer: Self-pay

## 2017-02-05 ENCOUNTER — Ambulatory Visit: Payer: Medicare HMO | Admitting: Family Medicine

## 2017-02-05 DIAGNOSIS — I10 Essential (primary) hypertension: Secondary | ICD-10-CM

## 2017-02-05 DIAGNOSIS — R7989 Other specified abnormal findings of blood chemistry: Secondary | ICD-10-CM

## 2017-02-05 NOTE — Progress Notes (Deleted)
       Patient: Kenneth Collins Male    DOB: Sep 19, 1945   72 y.o.   MRN: [id number] Visit Date: 02/05/2017  Today's Provider: Vernie Murders, PA   No chief complaint on file.  Subjective:    HPI  Hypertension, follow-up:  BP Readings from Last 3 Encounters:  01/25/17 (!) 124/56  11/06/16 136/68  07/17/16 (!) 158/74    He was last seen for hypertension 3 months ago.  BP at that visit was 136/68. Management changes since that visit include continue medications. He reports good compliance with treatment. He is not having side effects.  He is exercising. He is adherent to low salt diet.   Outside blood pressures are being checked periodically. He is experiencing none.  Patient denies chest pain, chest pressure/discomfort, claudication, dyspnea, exertional chest pressure/discomfort, fatigue, irregular heart beat, lower extremity edema, near-syncope, orthopnea, palpitations, paroxysmal nocturnal dyspnea, syncope and tachypnea.   Cardiovascular risk factors include advanced age (older than 36 for men, 88 for women), dyslipidemia, hypertension and male gender.  Use of agents associated with hypertension: none.     Weight trend: stable Wt Readings from Last 3 Encounters:  01/25/17 185 lb 12.8 oz (84.3 kg)  11/06/16 183 lb 3.2 oz (83.1 kg)  07/17/16 186 lb 6.4 oz (84.6 kg)    Current diet: high protein and low carb  ------------------------------------------------------------------------     No Known Allergies   Current Outpatient Medications:  .  amoxicillin (AMOXIL) 875 MG tablet, Take 1 tablet (875 mg total) by mouth 2 (two) times daily., Disp: 20 tablet, Rfl: 0 .  Ascorbic Acid (VITAMIN C) 500 MG CAPS, Take 500 mg by mouth daily. , Disp: , Rfl:  .  atenolol-chlorthalidone (TENORETIC) 50-25 MG tablet, TAKE 1 TABLET EVERY DAY, Disp: 90 tablet, Rfl: 3 .  cholecalciferol (VITAMIN D) 1000 UNITS tablet, Take 1,000 Units by mouth daily. , Disp: , Rfl:  .  co-enzyme Q-10 30 MG  capsule, Take 30 mg by mouth daily. , Disp: , Rfl:  .  Creatine POWD, Take 500 mg by mouth., Disp: , Rfl:  .  MULTIPLE VITAMIN PO, Take 1 tablet by mouth daily., Disp: , Rfl:  .  NAPROXEN SODIUM PO, daily as needed. Reported on 07/15/2015, Disp: , Rfl:   Review of Systems  Constitutional: Negative.   Respiratory: Negative.   Cardiovascular: Negative.     Social History   Tobacco Use  . Smoking status: Former Smoker    Types: Cigarettes  . Smokeless tobacco: Never Used  . Tobacco comment: > 50 years ago  Substance Use Topics  . Alcohol use: Yes    Alcohol/week: 0.0 oz    Comment: 1-2 drinks a month.    Objective:   There were no vitals taken for this visit.   Physical Exam      Assessment & Plan:           Vernie Murders, PA  [facility]

## 2017-02-06 LAB — CBC WITH DIFFERENTIAL/PLATELET
BASOS: 0 %
Basophils Absolute: 0 10*3/uL (ref 0.0–0.2)
EOS (ABSOLUTE): 0.2 10*3/uL (ref 0.0–0.4)
Eos: 2 %
HEMATOCRIT: 41.1 % (ref 37.5–51.0)
Hemoglobin: 13.8 g/dL (ref 13.0–17.7)
Immature Grans (Abs): 0 10*3/uL (ref 0.0–0.1)
Immature Granulocytes: 0 %
LYMPHS ABS: 2.1 10*3/uL (ref 0.7–3.1)
Lymphs: 23 %
MCH: 30.9 pg (ref 26.6–33.0)
MCHC: 33.6 g/dL (ref 31.5–35.7)
MCV: 92 fL (ref 79–97)
MONOS ABS: 0.7 10*3/uL (ref 0.1–0.9)
Monocytes: 8 %
Neutrophils Absolute: 6 10*3/uL (ref 1.4–7.0)
Neutrophils: 67 %
Platelets: 290 10*3/uL (ref 150–379)
RBC: 4.47 x10E6/uL (ref 4.14–5.80)
RDW: 13.3 % (ref 12.3–15.4)
WBC: 9 10*3/uL (ref 3.4–10.8)

## 2017-02-06 LAB — COMPREHENSIVE METABOLIC PANEL
A/G RATIO: 1.3 (ref 1.2–2.2)
ALK PHOS: 62 IU/L (ref 39–117)
ALT: 17 IU/L (ref 0–44)
AST: 22 IU/L (ref 0–40)
Albumin: 3.7 g/dL (ref 3.5–4.8)
BILIRUBIN TOTAL: 0.4 mg/dL (ref 0.0–1.2)
BUN/Creatinine Ratio: 22 (ref 10–24)
BUN: 31 mg/dL — ABNORMAL HIGH (ref 8–27)
CHLORIDE: 103 mmol/L (ref 96–106)
CO2: 24 mmol/L (ref 20–29)
Calcium: 9 mg/dL (ref 8.6–10.2)
Creatinine, Ser: 1.39 mg/dL — ABNORMAL HIGH (ref 0.76–1.27)
GFR calc Af Amer: 58 mL/min/{1.73_m2} — ABNORMAL LOW (ref >59)
GFR, EST NON AFRICAN AMERICAN: 50 mL/min/{1.73_m2} — AB (ref >59)
GLOBULIN, TOTAL: 2.9 g/dL (ref 1.5–4.5)
Glucose: 74 mg/dL (ref 65–99)
POTASSIUM: 4.2 mmol/L (ref 3.5–5.2)
SODIUM: 141 mmol/L (ref 134–144)
Total Protein: 6.6 g/dL (ref 6.0–8.5)

## 2017-02-07 ENCOUNTER — Telehealth: Payer: Self-pay

## 2017-02-07 NOTE — Telephone Encounter (Signed)
-----   Message from Margo Common, [place] sent at 02/07/2017  8:06 AM EST ----- Kidney function showing good improvement. Drink plenty of water. Recheck BP and renal function labs in 3 months.

## 2017-02-07 NOTE — Telephone Encounter (Signed)
Patient advised. 3 month follow up scheduled.

## 2017-02-25 DIAGNOSIS — D2271 Melanocytic nevi of right lower limb, including hip: Secondary | ICD-10-CM | POA: Diagnosis not present

## 2017-02-25 DIAGNOSIS — D0359 Melanoma in situ of other part of trunk: Secondary | ICD-10-CM | POA: Diagnosis not present

## 2017-02-25 DIAGNOSIS — L570 Actinic keratosis: Secondary | ICD-10-CM | POA: Diagnosis not present

## 2017-02-25 DIAGNOSIS — L821 Other seborrheic keratosis: Secondary | ICD-10-CM | POA: Diagnosis not present

## 2017-02-25 DIAGNOSIS — D225 Melanocytic nevi of trunk: Secondary | ICD-10-CM | POA: Diagnosis not present

## 2017-02-25 DIAGNOSIS — X32XXXA Exposure to sunlight, initial encounter: Secondary | ICD-10-CM | POA: Diagnosis not present

## 2017-02-25 DIAGNOSIS — D485 Neoplasm of uncertain behavior of skin: Secondary | ICD-10-CM | POA: Diagnosis not present

## 2017-02-25 DIAGNOSIS — D2261 Melanocytic nevi of right upper limb, including shoulder: Secondary | ICD-10-CM | POA: Diagnosis not present

## 2017-03-18 DIAGNOSIS — D0359 Melanoma in situ of other part of trunk: Secondary | ICD-10-CM | POA: Diagnosis not present

## 2017-05-09 ENCOUNTER — Ambulatory Visit (INDEPENDENT_AMBULATORY_CARE_PROVIDER_SITE_OTHER): Payer: Medicare HMO | Admitting: Family Medicine

## 2017-05-09 ENCOUNTER — Encounter: Payer: Self-pay | Admitting: Family Medicine

## 2017-05-09 VITALS — BP 136/78 | HR 50 | Temp 98.3°F | Wt 183.4 lb

## 2017-05-09 DIAGNOSIS — N183 Chronic kidney disease, stage 3 unspecified: Secondary | ICD-10-CM | POA: Insufficient documentation

## 2017-05-09 DIAGNOSIS — I10 Essential (primary) hypertension: Secondary | ICD-10-CM | POA: Diagnosis not present

## 2017-05-09 NOTE — Progress Notes (Addendum)
Patient: Kenneth Collins Male    DOB: 1945-10-05   72 y.o.   MRN: [id number] Visit Date: 05/09/2017  Today's Provider: Vernie Murders, PA   Chief Complaint  Patient presents with  . Hypertension  . Follow-up   Subjective:    HPI  Hypertension, follow-up:  BP Readings from Last 3 Encounters:  05/09/17 136/78  01/25/17 (!) 124/56  11/06/16 136/68    He was last seen for hypertension 6 months ago.  BP at that visit was 136/68. Management changes since that visit include continue medication. He reports good compliance with treatment. He is not having side effects.  He is exercising. He is adherent to low salt diet.   Outside blood pressures are being checked at times. He is experiencing none.  Patient denies chest pain, chest pressure/discomfort, claudication, dyspnea, exertional chest pressure/discomfort, fatigue, irregular heart beat, lower extremity edema, near-syncope, orthopnea, palpitations, paroxysmal nocturnal dyspnea, syncope and tachypnea.   Cardiovascular risk factors include advanced age (older than 42 for men, 37 for women), dyslipidemia, hypertension and male gender.  Use of agents associated with hypertension: none.                Weight trend: stable Wt Readings from Last 3 Encounters:  05/09/17 183 lb 6.4 oz (83.2 kg)  01/25/17 185 lb 12.8 oz (84.3 kg)  11/06/16 183 lb 3.2 oz (83.1 kg)   Current diet: high protein, low carb  ------------------------------------------------------------------------ Past Medical History:  Diagnosis Date  . Hypertension    Past Surgical History:  Procedure Laterality Date  . COLONOSCOPY WITH PROPOFOL N/A 09/20/2015   Procedure: COLONOSCOPY WITH PROPOFOL;  Surgeon: [name], MD;  Location: [facility] ENDOSCOPY;  Service: Endoscopy;  Laterality: N/A;  . MICRODISCECTOMY LUMBAR  03/17/2013  . VASECTOMY     Family History  Problem Relation Age of Onset  . Heart disease Father   . Pneumonia Mother   . Cancer  Maternal Uncle    No Known Allergies  Current Outpatient Medications:  .  Ascorbic Acid (VITAMIN C) 500 MG CAPS, Take 500 mg by mouth daily. , Disp: , Rfl:  .  atenolol-chlorthalidone (TENORETIC) 50-25 MG tablet, TAKE 1 TABLET EVERY DAY, Disp: 90 tablet, Rfl: 3 .  cholecalciferol (VITAMIN D) 1000 UNITS tablet, Take 1,000 Units by mouth daily. , Disp: , Rfl:  .  co-enzyme Q-10 30 MG capsule, Take 30 mg by mouth daily. , Disp: , Rfl:  .  Creatine POWD, Take 500 mg by mouth., Disp: , Rfl:  .  MULTIPLE VITAMIN PO, Take 1 tablet by mouth daily., Disp: , Rfl:  .  NAPROXEN SODIUM PO, daily as needed. Reported on 07/15/2015, Disp: , Rfl:   Review of Systems  Constitutional: Negative.   Respiratory: Negative.   Cardiovascular: Negative.    Social History   Tobacco Use  . Smoking status: Former Smoker    Types: Cigarettes  . Smokeless tobacco: Never Used  . Tobacco comment: > 50 years ago  Substance Use Topics  . Alcohol use: Yes    Alcohol/week: 0.0 oz    Comment: 1-2 drinks a month.    Objective:   BP 136/78 (BP Location: Right Arm, Patient Position: Sitting, Cuff Size: Normal)   Pulse (!) 50   Temp 98.3 F (36.8 C) (Oral)   Wt 183 lb 6.4 oz (83.2 kg)   SpO2 97%   BMI 29.60 kg/m  Wt Readings from Last 3 Encounters:  05/09/17 183 lb 6.4 oz (83.2 kg)  01/25/17 185 lb 12.8 oz (84.3 kg)  11/06/16 183 lb 3.2 oz (83.1 kg)   Physical Exam  Constitutional: He is oriented to person, place, and time. He appears well-developed and well-nourished. No distress.  HENT:  Head: Normocephalic and atraumatic.  Right Ear: Hearing normal.  Left Ear: Hearing normal.  Nose: Nose normal.  Eyes: Conjunctivae and lids are normal. Right eye exhibits no discharge. Left eye exhibits no discharge. No scleral icterus.  Neck: Neck supple. No JVD present. No thyromegaly present.  Cardiovascular: Normal rate and regular rhythm.  Pulmonary/Chest: Effort normal and breath sounds normal. No respiratory  distress.  Abdominal: Soft. Bowel sounds are normal.  Musculoskeletal: Normal range of motion.  Lymphadenopathy:    He has no cervical adenopathy.  Neurological: He is alert and oriented to person, place, and time.  Skin: Skin is intact. No lesion and no rash noted.  Psychiatric: He has a normal mood and affect. His speech is normal and behavior is normal. Thought content normal.      Assessment & Plan:     1. Essential (primary) hypertension Well controlled BP without chest pains, palpitations, edema or dyspnea. Continues Tenoretic 50-25 mg qd without side effects. Recheck labs and follow up pending reports. - CBC with Differential/Platelet - Comprehensive metabolic panel  2. CKD (chronic kidney disease) stage 3, GFR 30-59 ml/min (HCC) Creatinine was 1.77 with GFR 37 and potassium 4.6 on 11-06-16. Feeling well and following a high protein low fat diet. Continue plenty of water intake and recheck CBC with CMP to assess progress. - CBC with Differential/Platelet - Comprehensive metabolic panel       Vernie Murders, PA  [facility]

## 2017-05-10 LAB — CBC WITH DIFFERENTIAL/PLATELET
BASOS ABS: 0 10*3/uL (ref 0.0–0.2)
BASOS: 0 %
EOS (ABSOLUTE): 0.1 10*3/uL (ref 0.0–0.4)
Eos: 2 %
Hematocrit: 42.7 % (ref 37.5–51.0)
Hemoglobin: 15 g/dL (ref 13.0–17.7)
IMMATURE GRANS (ABS): 0 10*3/uL (ref 0.0–0.1)
Immature Granulocytes: 0 %
Lymphocytes Absolute: 1.8 10*3/uL (ref 0.7–3.1)
Lymphs: 20 %
MCH: 31 pg (ref 26.6–33.0)
MCHC: 35.1 g/dL (ref 31.5–35.7)
MCV: 88 fL (ref 79–97)
Monocytes Absolute: 0.8 10*3/uL (ref 0.1–0.9)
Monocytes: 9 %
NEUTROS PCT: 69 %
Neutrophils Absolute: 6.2 10*3/uL (ref 1.4–7.0)
PLATELETS: 207 10*3/uL (ref 150–379)
RBC: 4.84 x10E6/uL (ref 4.14–5.80)
RDW: 14.4 % (ref 12.3–15.4)
WBC: 8.9 10*3/uL (ref 3.4–10.8)

## 2017-05-10 LAB — COMPREHENSIVE METABOLIC PANEL
ALT: 20 IU/L (ref 0–44)
AST: 21 IU/L (ref 0–40)
Albumin/Globulin Ratio: 1.4 (ref 1.2–2.2)
Albumin: 3.8 g/dL (ref 3.5–4.8)
Alkaline Phosphatase: 60 IU/L (ref 39–117)
BUN/Creatinine Ratio: 25 — ABNORMAL HIGH (ref 10–24)
BUN: 34 mg/dL — AB (ref 8–27)
Bilirubin Total: 0.4 mg/dL (ref 0.0–1.2)
CALCIUM: 9 mg/dL (ref 8.6–10.2)
CO2: 25 mmol/L (ref 20–29)
Chloride: 102 mmol/L (ref 96–106)
Creatinine, Ser: 1.38 mg/dL — ABNORMAL HIGH (ref 0.76–1.27)
GFR, EST AFRICAN AMERICAN: 58 mL/min/{1.73_m2} — AB (ref >59)
GFR, EST NON AFRICAN AMERICAN: 50 mL/min/{1.73_m2} — AB (ref >59)
GLUCOSE: 85 mg/dL (ref 65–99)
Globulin, Total: 2.7 g/dL (ref 1.5–4.5)
Potassium: 4.3 mmol/L (ref 3.5–5.2)
Sodium: 141 mmol/L (ref 134–144)
TOTAL PROTEIN: 6.5 g/dL (ref 6.0–8.5)

## 2017-06-12 DIAGNOSIS — H524 Presbyopia: Secondary | ICD-10-CM | POA: Diagnosis not present

## 2017-06-18 DIAGNOSIS — L905 Scar conditions and fibrosis of skin: Secondary | ICD-10-CM | POA: Diagnosis not present

## 2017-06-18 DIAGNOSIS — L02222 Furuncle of back [any part, except buttock]: Secondary | ICD-10-CM | POA: Diagnosis not present

## 2017-06-18 DIAGNOSIS — L821 Other seborrheic keratosis: Secondary | ICD-10-CM | POA: Diagnosis not present

## 2017-06-18 DIAGNOSIS — Z08 Encounter for follow-up examination after completed treatment for malignant neoplasm: Secondary | ICD-10-CM | POA: Diagnosis not present

## 2017-06-18 DIAGNOSIS — Z85820 Personal history of malignant melanoma of skin: Secondary | ICD-10-CM | POA: Diagnosis not present

## 2017-07-02 ENCOUNTER — Other Ambulatory Visit: Payer: Self-pay | Admitting: Family Medicine

## 2017-08-08 ENCOUNTER — Ambulatory Visit (INDEPENDENT_AMBULATORY_CARE_PROVIDER_SITE_OTHER): Payer: Medicare HMO

## 2017-08-08 ENCOUNTER — Encounter: Payer: Medicare HMO | Admitting: Family Medicine

## 2017-08-08 VITALS — BP 136/60 | HR 52 | Temp 98.8°F | Ht 66.0 in | Wt 185.8 lb

## 2017-08-08 DIAGNOSIS — Z0000 Encounter for general adult medical examination without abnormal findings: Secondary | ICD-10-CM | POA: Diagnosis not present

## 2017-08-08 NOTE — Patient Instructions (Signed)
Kenneth Collins , Thank you for taking time to come for your Medicare Wellness Visit. I appreciate your ongoing commitment to your health goals. Please review the following plan we discussed and let me know if I can assist you in the future.   Screening recommendations/referrals: Colonoscopy: Up to date Recommended yearly ophthalmology/optometry visit for glaucoma screening and checkup Recommended yearly dental visit for hygiene and checkup  Vaccinations: Influenza vaccine: N/A Pneumococcal vaccine: Pneumovax up to date, pt declined Prevnar 13 today. Tdap vaccine: Up to date Shingles vaccine: Pt declines today.     Advanced directives: Please bring a copy of your POA (Power of Attorney) and/or Living Will to your next appointment.   Conditions/risks identified: Continue increasing water intake to 6-8 glasses.   Next appointment: 09/09/17 @ 1:20 PM with Kenneth Collins.  Preventive Care 74 Years and Older, Male Preventive care refers to lifestyle choices and visits with your health care provider that can promote health and wellness. What does preventive care include?  A yearly physical exam. This is also called an annual well check.  Dental exams once or twice a year.  Routine eye exams. Ask your health care provider how often you should have your eyes checked.  Personal lifestyle choices, including:  Daily care of your teeth and gums.  Regular physical activity.  Eating a healthy diet.  Avoiding tobacco and drug use.  Limiting alcohol use.  Practicing safe sex.  Taking low doses of aspirin every day.  Taking vitamin and mineral supplements as recommended by your health care provider. What happens during an annual well check? The services and screenings done by your health care provider during your annual well check will depend on your age, overall health, lifestyle risk factors, and family history of disease. Counseling  Your health care provider may ask you questions  about your:  Alcohol use.  Tobacco use.  Drug use.  Emotional well-being.  Home and relationship well-being.  Sexual activity.  Eating habits.  History of falls.  Memory and ability to understand (cognition).  Work and work Statistician. Screening  You may have the following tests or measurements:  Height, weight, and BMI.  Blood pressure.  Lipid and cholesterol levels. These may be checked every 5 years, or more frequently if you are over 75 years old.  Skin check.  Lung cancer screening. You may have this screening every year starting at age 66 if you have a 30-pack-year history of smoking and currently smoke or have quit within the past 15 years.  Fecal occult blood test (FOBT) of the stool. You may have this test every year starting at age 65.  Flexible sigmoidoscopy or colonoscopy. You may have a sigmoidoscopy every 5 years or a colonoscopy every 10 years starting at age 22.  Prostate cancer screening. Recommendations will vary depending on your family history and other risks.  Hepatitis C blood test.  Hepatitis B blood test.  Sexually transmitted disease (STD) testing.  Diabetes screening. This is done by checking your blood sugar (glucose) after you have not eaten for a while (fasting). You may have this done every 1-3 years.  Abdominal aortic aneurysm (AAA) screening. You may need this if you are a current or former smoker.  Osteoporosis. You may be screened starting at age 1 if you are at high risk. Talk with your health care provider about your test results, treatment options, and if necessary, the need for more tests. Vaccines  Your health care provider may recommend certain vaccines, such  as:  Influenza vaccine. This is recommended every year.  Tetanus, diphtheria, and acellular pertussis (Tdap, Td) vaccine. You may need a Td booster every 10 years.  Zoster vaccine. You may need this after age 78.  Pneumococcal 13-valent conjugate (PCV13)  vaccine. One dose is recommended after age 42.  Pneumococcal polysaccharide (PPSV23) vaccine. One dose is recommended after age 37. Talk to your health care provider about which screenings and vaccines you need and how often you need them. This information is not intended to replace advice given to you by your health care provider. Make sure you discuss any questions you have with your health care provider. Document Released: 01/14/2015 Document Revised: 09/07/2015 Document Reviewed: 10/19/2014 Elsevier Interactive Patient Education  2017 [place] Prevention in the Home Falls can cause injuries. They can happen to people of all ages. There are many things you can do to make your home safe and to help prevent falls. What can I do on the outside of my home?  Regularly fix the edges of walkways and driveways and fix any cracks.  Remove anything that might make you trip as you walk through a door, such as a raised step or threshold.  Trim any bushes or trees on the path to your home.  Use bright outdoor lighting.  Clear any walking paths of anything that might make someone trip, such as rocks or tools.  Regularly check to see if handrails are loose or broken. Make sure that both sides of any steps have handrails.  Any raised decks and porches should have guardrails on the edges.  Have any leaves, snow, or ice cleared regularly.  Use sand or salt on walking paths during winter.  Clean up any spills in your garage right away. This includes oil or grease spills. What can I do in the bathroom?  Use night lights.  Install grab bars by the toilet and in the tub and shower. Do not use towel bars as grab bars.  Use non-skid mats or decals in the tub or shower.  If you need to sit down in the shower, use a plastic, non-slip stool.  Keep the floor dry. Clean up any water that spills on the floor as soon as it happens.  Remove soap buildup in the tub or shower  regularly.  Attach bath mats securely with double-sided non-slip rug tape.  Do not have throw rugs and other things on the floor that can make you trip. What can I do in the bedroom?  Use night lights.  Make sure that you have a light by your bed that is easy to reach.  Do not use any sheets or blankets that are too big for your bed. They should not hang down onto the floor.  Have a firm chair that has side arms. You can use this for support while you get dressed.  Do not have throw rugs and other things on the floor that can make you trip. What can I do in the kitchen?  Clean up any spills right away.  Avoid walking on wet floors.  Keep items that you use a lot in easy-to-reach places.  If you need to reach something above you, use a strong step stool that has a grab bar.  Keep electrical cords out of the way.  Do not use floor polish or wax that makes floors slippery. If you must use wax, use non-skid floor wax.  Do not have throw rugs and other things on the  floor that can make you trip. What can I do with my stairs?  Do not leave any items on the stairs.  Make sure that there are handrails on both sides of the stairs and use them. Fix handrails that are broken or loose. Make sure that handrails are as long as the stairways.  Check any carpeting to make sure that it is firmly attached to the stairs. Fix any carpet that is loose or worn.  Avoid having throw rugs at the top or bottom of the stairs. If you do have throw rugs, attach them to the floor with carpet tape.  Make sure that you have a light switch at the top of the stairs and the bottom of the stairs. If you do not have them, ask someone to add them for you. What else can I do to help prevent falls?  Wear shoes that:  Do not have high heels.  Have rubber bottoms.  Are comfortable and fit you well.  Are closed at the toe. Do not wear sandals.  If you use a stepladder:  Make sure that it is fully  opened. Do not climb a closed stepladder.  Make sure that both sides of the stepladder are locked into place.  Ask someone to hold it for you, if possible.  Clearly mark and make sure that you can see:  Any grab bars or handrails.  First and last steps.  Where the edge of each step is.  Use tools that help you move around (mobility aids) if they are needed. These include:  Canes.  Walkers.  Scooters.  Crutches.  Turn on the lights when you go into a dark area. Replace any light bulbs as soon as they burn out.  Set up your furniture so you have a clear path. Avoid moving your furniture around.  If any of your floors are uneven, fix them.  If there are any pets around you, be aware of where they are.  Review your medicines with your doctor. Some medicines can make you feel dizzy. This can increase your chance of falling. Ask your doctor what other things that you can do to help prevent falls. This information is not intended to replace advice given to you by your health care provider. Make sure you discuss any questions you have with your health care provider. Document Released: 10/14/2008 Document Revised: 05/26/2015 Document Reviewed: 01/22/2014 Elsevier Interactive Patient Education  2017 [employer].

## 2017-08-08 NOTE — Progress Notes (Addendum)
Subjective:   Kenneth Collins is a 72 y.o. male who presents for Medicare Annual/Subsequent preventive examination.  Review of Systems:  N/A  Cardiac Risk Factors include: advanced age (>41men, >61 women);hypertension;male gender     Objective:    Vitals: BP 136/60 (BP Location: Right Arm)   Pulse (!) 52   Temp 98.8 F (37.1 C) (Oral)   Ht 5\' 6"  (1.676 m)   Wt 185 lb 12.8 oz (84.3 kg)   BMI 29.99 kg/m   Body mass index is 29.99 kg/m.  Advanced Directives 08/08/2017 07/17/2016 09/20/2015 07/12/2015 06/29/2014  Does Patient Have a Medical Advance Directive? Yes No No No No  Type of Paramedic of [place];Living will - - - -  Copy of [place] in Chart? No - copy requested - - - -  Would patient like information on creating a medical advance directive? - No - Patient declined - - -    Tobacco Social History   Tobacco Use  Smoking Status Former Smoker  . Types: Cigarettes  Smokeless Tobacco Never Used  Tobacco Comment   > 50 years ago     Counseling given: Not Answered Comment: > 50 years ago   Clinical Intake:  Pre-visit preparation completed: Yes  Pain : No/denies pain Pain Score: 0-No pain     Nutritional Status: BMI 25 -29 Overweight Nutritional Risks: None Diabetes: No  How often do you need to have someone help you when you read instructions, pamphlets, or other written materials from your doctor or pharmacy?: 1 - Never  Interpreter Needed?: No  Information entered by :: [facility], LPN  Past Medical History:  Diagnosis Date  . Hypertension    Past Surgical History:  Procedure Laterality Date  . COLONOSCOPY WITH PROPOFOL N/A 09/20/2015   Procedure: COLONOSCOPY WITH PROPOFOL;  Surgeon: [name], MD;  Location: [facility] ENDOSCOPY;  Service: Endoscopy;  Laterality: N/A;  . MICRODISCECTOMY LUMBAR  03/17/2013  . VASECTOMY     Family History  Problem Relation Age of Onset  . Heart disease Father   . Pneumonia  Mother   . Cancer Maternal Uncle    Social History   Socioeconomic History  . Marital status: Married    Spouse name: Not on file  . Number of children: 2  . Years of education: Not on file  . Highest education level: 12th grade  Occupational History  . Occupation: retired  Scientific laboratory technician  . Financial resource strain: Not hard at all  . Food insecurity:    Worry: Never true    Inability: Never true  . Transportation needs:    Medical: No    Non-medical: No  Tobacco Use  . Smoking status: Former Smoker    Types: Cigarettes  . Smokeless tobacco: Never Used  . Tobacco comment: > 50 years ago  Substance and Sexual Activity  . Alcohol use: Yes    Alcohol/week: 0.0 standard drinks    Comment: 0-2 glasses of wine a month.   . Drug use: No  . Sexual activity: Not on file  Lifestyle  . Physical activity:    Days per week: Not on file    Minutes per session: Not on file  . Stress: Not at all  Relationships  . Social connections:    Talks on phone: Not on file    Gets together: Not on file    Attends religious service: Not on file    Active member of club or organization: Not on  file    Attends meetings of clubs or organizations: Not on file    Relationship status: Not on file  Other Topics Concern  . Not on file  Social History Narrative  . Not on file    Outpatient Encounter Medications as of 08/08/2017  Medication Sig  . atenolol-chlorthalidone (TENORETIC) 50-25 MG tablet TAKE 1 TABLET EVERY DAY  . NAPROXEN SODIUM PO daily as needed. Reported on 07/15/2015  . PROTEIN PO Take by mouth. 20 grams of protein 3 times a week  . Ascorbic Acid (VITAMIN C) 500 MG CAPS Take 500 mg by mouth daily.   . cholecalciferol (VITAMIN D) 1000 UNITS tablet Take 1,000 Units by mouth daily.   Marland Kitchen co-enzyme Q-10 30 MG capsule Take 30 mg by mouth daily.   . Creatine POWD Take 500 mg by mouth.  . MULTIPLE VITAMIN PO Take 1 tablet by mouth daily.   No facility-administered encounter medications  on file as of 08/08/2017.     Activities of Daily Living In your present state of health, do you have any difficulty performing the following activities: 08/08/2017  Hearing? Y  Comment Difficulty hearing out of right ear, does not wear hearing aids.   Vision? N  Difficulty concentrating or making decisions? N  Walking or climbing stairs? N  Dressing or bathing? N  Doing errands, shopping? N  Preparing Food and eating ? N  Using the Toilet? N  In the past six months, have you accidently leaked urine? N  Do you have problems with loss of bowel control? N  Managing your Medications? N  Managing your Finances? N  Housekeeping or managing your Housekeeping? N  Some recent data might be hidden    Patient Care Team: Chrismon, Vickki Muff, PA as PCP - General (Physician Assistant)   Assessment:   This is a routine wellness examination for Kenneth Collins.  Exercise Activities and Dietary recommendations Current Exercise Habits: Structured exercise class, Type of exercise: strength training/weights;stretching;Other - see comments;treadmill(eliptical), Time (Minutes): 60(or 1.5 hours), Frequency (Times/Week): 3, Weekly Exercise (Minutes/Week): 180, Intensity: Mild, Exercise limited by: None identified  Goals    . Increase water intake     Recommend increasing water intake to 4-6 glasses a day.       Fall Risk Fall Risk  08/08/2017 07/17/2016 07/12/2015 06/29/2014  Falls in the past year? No No No No   Is the patient's home free of loose throw rugs in walkways, pet beds, electrical cords, etc?   yes      Grab bars in the bathroom? no      Handrails on the stairs?   no      Adequate lighting?   yes  Timed Get Up and Go Performed: N/A  Depression Screen PHQ 2/9 Scores 08/08/2017 08/08/2017 07/17/2016 07/17/2016  PHQ - 2 Score 0 0 0 0  PHQ- 9 Score 0 - 0 -    Cognitive Function: Pt declined screening today.      6CIT Screen 07/17/2016  What Year? 0 points  What month? 0 points  What time? 0 points    Count back from 20 0 points  Months in reverse 0 points  Repeat phrase 2 points  Total Score 2    Immunization History  Administered Date(s) Administered  . Influenza Split 10/09/2011  . Pneumococcal Polysaccharide-23 10/09/2011  . Tdap 10/19/2010    Qualifies for Shingles Vaccine? Due for Shingles vaccine. Declined my offer to administer today. Education has been provided regarding the importance of  this vaccine. Pt has been advised to call her insurance company to determine her out of pocket expense. Advised she may also receive this vaccine at her local pharmacy or Health Dept. Verbalized acceptance and understanding.  Screening Tests Health Maintenance  Topic Date Due  . PNA vac Low Risk Adult (2 of 2 - PCV13) 10/08/2012  . INFLUENZA VACCINE  08/01/2017  . TETANUS/TDAP  10/18/2020  . COLONOSCOPY  09/19/2025  . Hepatitis C Screening  Completed   Cancer Screenings: Lung: Low Dose CT Chest recommended if Age 65-80 years, 30 pack-year currently smoking OR have quit w/in 15years. Patient does not qualify. Colorectal: Up to date  Additional Screenings:  Hepatitis C Screening: Up to date      Plan:  I have personally reviewed and addressed the Medicare Annual Wellness questionnaire and have noted the following in the patient's chart:  A. Medical and social history B. Use of alcohol, tobacco or illicit drugs  C. Current medications and supplements D. Functional ability and status E.  Nutritional status F.  Physical activity G. Advance directives H. List of other physicians I.  Hospitalizations, surgeries, and ER visits in previous 12 months J.  Corder such as hearing and vision if needed, cognitive and depression L. Referrals and appointments - none  In addition, I have reviewed and discussed with patient certain preventive protocols, quality metrics, and best practice recommendations. A written personalized care plan for preventive services as well as  general preventive health recommendations were provided to patient.  See attached scanned questionnaire for additional information.   Signed,  Fabio Neighbors, LPN Nurse Health Advisor   Nurse Recommendations: Pt declined the Prevnar 13 vaccine today.   Reviewed Nurse Health Advisor's documentation and recommendations. Agree with note and recommendations. Will review with patient at his next appointment.

## 2017-09-09 ENCOUNTER — Ambulatory Visit (INDEPENDENT_AMBULATORY_CARE_PROVIDER_SITE_OTHER): Payer: Medicare HMO | Admitting: Family Medicine

## 2017-09-09 ENCOUNTER — Encounter: Payer: Self-pay | Admitting: Family Medicine

## 2017-09-09 VITALS — BP 138/76 | HR 50 | Temp 98.4°F | Wt 184.0 lb

## 2017-09-09 DIAGNOSIS — N183 Chronic kidney disease, stage 3 unspecified: Secondary | ICD-10-CM

## 2017-09-09 DIAGNOSIS — Z8719 Personal history of other diseases of the digestive system: Secondary | ICD-10-CM | POA: Diagnosis not present

## 2017-09-09 DIAGNOSIS — I10 Essential (primary) hypertension: Secondary | ICD-10-CM | POA: Diagnosis not present

## 2017-09-09 NOTE — Progress Notes (Signed)
Patient: Kenneth Collins, Male    DOB: 24-Jun-1945, 72 y.o.   MRN: [id number] Visit Date: 09/09/2017  Today's Provider: Vernie Murders, PA   Chief Complaint  Patient presents with  . Medicare Wellness   Subjective:     Complete Physical Kenneth Collins is a 72 y.o. male. He feels well. He reports exercising regularly. He reports he is sleeping well.  -----------------------------------------------------------   Review of Systems  Constitutional: Negative.   HENT: Negative.   Eyes: Negative.   Respiratory: Negative.   Cardiovascular: Negative.   Gastrointestinal: Negative.   Endocrine: Negative.   Genitourinary: Negative.   Musculoskeletal: Negative.    Social History   Socioeconomic History  . Marital status: Married    Spouse name: Not on file  . Number of children: 2  . Years of education: Not on file  . Highest education level: 12th grade  Occupational History  . Occupation: retired  Scientific laboratory technician  . Financial resource strain: Not hard at all  . Food insecurity:    Worry: Never true    Inability: Never true  . Transportation needs:    Medical: No    Non-medical: No  Tobacco Use  . Smoking status: Former Smoker    Types: Cigarettes  . Smokeless tobacco: Never Used  . Tobacco comment: > 50 years ago  Substance and Sexual Activity  . Alcohol use: Yes    Alcohol/week: 0.0 standard drinks    Comment: 0-2 glasses of wine a month.   . Drug use: No  . Sexual activity: Not on file  Lifestyle  . Physical activity:    Days per week: Not on file    Minutes per session: Not on file  . Stress: Not at all  Relationships  . Social connections:    Talks on phone: Not on file    Gets together: Not on file    Attends religious service: Not on file    Active member of club or organization: Not on file    Attends meetings of clubs or organizations: Not on file    Relationship status: Not on file  . Intimate partner violence:    Fear of current or ex partner: Not  on file    Emotionally abused: Not on file    Physically abused: Not on file    Forced sexual activity: Not on file  Other Topics Concern  . Not on file  Social History Narrative  . Not on file    Past Medical History:  Diagnosis Date  . Hypertension     Patient Active Problem List   Diagnosis Date Noted  . CKD (chronic kidney disease) stage 3, GFR 30-59 ml/min (HCC) 05/09/2017  . Special screening for malignant neoplasms, colon   . Ulceration of intestine   . Chicken pox 04/16/2014  . Displacement of lumbar intervertebral disc without myelopathy 04/16/2014  . Personal history of tobacco use, presenting hazards to health 04/16/2014  . Received influenza vaccination at hospital 04/16/2014  . LBP (low back pain) 04/16/2014  . Need for vaccination 04/16/2014  . Pneumococcal vaccination given 04/16/2014  . Special screening for malignant neoplasm of prostate 04/16/2014  . Screening for ischemic heart disease 04/16/2014  . Basal cell papilloma 05/07/2008  . Screening for lipoid disorders 05/07/2008  . Acute conjunctivitis 02/05/2007  . Acute upper respiratory infection 10/17/2006  . Prostatitis 10/17/2006  . Difficult or painful urination 10/08/2006  . Febrile 10/08/2006  . Routine general medical examination at a  health care facility 10/22/2005  . Blood in the urine 10/22/2005  . Tobacco use 04/20/2005  . Essential (primary) hypertension 10/20/2004    Past Surgical History:  Procedure Laterality Date  . COLONOSCOPY WITH PROPOFOL N/A 09/20/2015   Procedure: COLONOSCOPY WITH PROPOFOL;  Surgeon: [name], MD;  Location: [facility] ENDOSCOPY;  Service: Endoscopy;  Laterality: N/A;  . MICRODISCECTOMY LUMBAR  03/17/2013  . VASECTOMY      His family history includes Cancer in his maternal uncle; Heart disease in his father; Pneumonia in his mother.      Current Outpatient Medications:  .  atenolol-chlorthalidone (TENORETIC) 50-25 MG tablet, TAKE 1 TABLET EVERY DAY, Disp: 90  tablet, Rfl: 3 .  Ascorbic Acid (VITAMIN C) 500 MG CAPS, Take 500 mg by mouth daily. , Disp: , Rfl:  .  cholecalciferol (VITAMIN D) 1000 UNITS tablet, Take 1,000 Units by mouth daily. , Disp: , Rfl:  .  co-enzyme Q-10 30 MG capsule, Take 30 mg by mouth daily. , Disp: , Rfl:  .  Creatine POWD, Take 500 mg by mouth., Disp: , Rfl:  .  MULTIPLE VITAMIN PO, Take 1 tablet by mouth daily., Disp: , Rfl:  .  NAPROXEN SODIUM PO, daily as needed. Reported on 07/15/2015, Disp: , Rfl:  .  PROTEIN PO, Take by mouth. 20 grams of protein 3 times a week, Disp: , Rfl:   Patient Care Team: Nohealani Medinger, Vickki Muff, PA as PCP - General (Physician Assistant)     Objective:   Vitals: BP 138/76 (BP Location: Right Arm, Patient Position: Sitting, Cuff Size: Normal)   Pulse (!) 50   Temp 98.4 F (36.9 C) (Oral)   Wt 184 lb (83.5 kg)   SpO2 97%   BMI 29.70 kg/m   Physical Exam  Constitutional: He is oriented to person, place, and time. He appears well-developed and well-nourished.  HENT:  Head: Normocephalic and atraumatic.  Right Ear: External ear normal.  Left Ear: External ear normal.  Nose: Nose normal.  Mouth/Throat: Oropharynx is clear and moist.  Eyes: Pupils are equal, round, and reactive to light. Conjunctivae and EOM are normal. Right eye exhibits no discharge.  Neck: Normal range of motion. Neck supple. No tracheal deviation present. No thyromegaly present.  Cardiovascular: Normal rate, regular rhythm, normal heart sounds and intact distal pulses.  No murmur heard. Pulmonary/Chest: Effort normal and breath sounds normal. No respiratory distress. He has no wheezes. He has no rales. He exhibits no tenderness.  Abdominal: Soft. He exhibits no distension and no mass. There is no tenderness. There is no rebound and no guarding.  Genitourinary: Rectum normal, prostate normal and penis normal. Rectal exam shows guaiac negative stool.  Musculoskeletal: Normal range of motion. He exhibits no edema or  tenderness.  Lymphadenopathy:    He has no cervical adenopathy.  Neurological: He is alert and oriented to person, place, and time. He has normal reflexes. He displays normal reflexes. No cranial nerve deficit. He exhibits normal muscle tone. Coordination normal.  Skin: Skin is warm and dry. No rash noted. No erythema.  Psychiatric: He has a normal mood and affect. His behavior is normal. Judgment and thought content normal.    Activities of Daily Living In your present state of health, do you have any difficulty performing the following activities: 08/08/2017  Hearing? Y  Comment Difficulty hearing out of right ear, does not wear hearing aids.   Vision? N  Difficulty concentrating or making decisions? N  Walking or climbing stairs? N  Dressing or bathing? N  Doing errands, shopping? N  Preparing Food and eating ? N  Using the Toilet? N  In the past six months, have you accidently leaked urine? N  Do you have problems with loss of bowel control? N  Managing your Medications? N  Managing your Finances? N  Housekeeping or managing your Housekeeping? N  Some recent data might be hidden    Fall Risk Assessment Fall Risk  08/08/2017 07/17/2016 07/12/2015 06/29/2014  Falls in the past year? No No No No   Depression Screen PHQ 2/9 Scores 08/08/2017 08/08/2017 07/17/2016 07/17/2016  PHQ - 2 Score 0 0 0 0  PHQ- 9 Score 0 - 0 -   Assessment & Plan:    Annual Physical Reviewed patient's Family Medical History Reviewed and updated list of patient's medical providers Assessment of cognitive impairment was done Assessed patient's functional ability Established a written schedule for health screening [place] Completed and Reviewed  Exercise Activities and Dietary recommendations Goals    . Increase water intake     Recommend increasing water intake to 4-6 glasses a day.       Immunization History  Administered Date(s) Administered  . Influenza Split 10/09/2011  .  Pneumococcal Polysaccharide-23 10/09/2011  . Tdap 10/19/2010    Health Maintenance  Topic Date Due  . PNA vac Low Risk Adult (2 of 2 - PCV13) 10/08/2012  . INFLUENZA VACCINE  08/01/2017  . TETANUS/TDAP  10/18/2020  . COLONOSCOPY  09/19/2025  . Hepatitis C Screening  Completed   Discussed health benefits of physical activity, and encouraged him to engage in regular exercise appropriate for his age and condition.    ------------------------------------------------------------------------------------------------------------ 1. Essential (primary) hypertension BP well controlled and tolerating Tenoretic 50-25 mg qd without side effects. Recheck labs and continue present regimen with salt restriction. Continues to exercise regularly. Refuses Prevnar-13 and shingles vaccinations today. Follow up pending lab reports. - CBC with Differential/Platelet - Comprehensive metabolic panel - Lipid panel - TSH  2. CKD (chronic kidney disease) stage 3, GFR 30-59 ml/min (HCC) No urinary tract symptoms. No nocturia or prostatic nodules on DRE. Only mild BPH. Creatinine was 1.38 with GFR <50 on 05-10-17. Will check CBC and CMP. Encouraged to drink extra water daily in diet. - CBC with Differential/Platelet - Comprehensive metabolic panel  3. History of diverticulosis Last episode of diverticulitis was in January 2019. No recurrences or blood in stools. Feeling well without constipation or diarrhea. Last colonoscopy was 09-10-15 without polyps but had diverticulosis in entire area examined. DRE normal with negative hemoccult today. - CBC with Differential/Platelet    Vernie Murders, PA  [facility]

## 2017-09-17 DIAGNOSIS — N183 Chronic kidney disease, stage 3 (moderate): Secondary | ICD-10-CM | POA: Diagnosis not present

## 2017-09-17 DIAGNOSIS — I10 Essential (primary) hypertension: Secondary | ICD-10-CM | POA: Diagnosis not present

## 2017-09-17 DIAGNOSIS — Z8719 Personal history of other diseases of the digestive system: Secondary | ICD-10-CM | POA: Diagnosis not present

## 2017-09-18 LAB — CBC WITH DIFFERENTIAL/PLATELET
BASOS ABS: 0 10*3/uL (ref 0.0–0.2)
Basos: 0 %
EOS (ABSOLUTE): 0.1 10*3/uL (ref 0.0–0.4)
EOS: 2 %
HEMATOCRIT: 41.4 % (ref 37.5–51.0)
HEMOGLOBIN: 14.8 g/dL (ref 13.0–17.7)
IMMATURE GRANULOCYTES: 0 %
Immature Grans (Abs): 0 10*3/uL (ref 0.0–0.1)
Lymphocytes Absolute: 1.9 10*3/uL (ref 0.7–3.1)
Lymphs: 22 %
MCH: 31.8 pg (ref 26.6–33.0)
MCHC: 35.7 g/dL (ref 31.5–35.7)
MCV: 89 fL (ref 79–97)
MONOCYTES: 7 %
Monocytes Absolute: 0.6 10*3/uL (ref 0.1–0.9)
NEUTROS PCT: 69 %
Neutrophils Absolute: 5.8 10*3/uL (ref 1.4–7.0)
Platelets: 219 10*3/uL (ref 150–450)
RBC: 4.66 x10E6/uL (ref 4.14–5.80)
RDW: 12.6 % (ref 12.3–15.4)
WBC: 8.5 10*3/uL (ref 3.4–10.8)

## 2017-09-18 LAB — LIPID PANEL
CHOL/HDL RATIO: 3.6 ratio (ref 0.0–5.0)
CHOLESTEROL TOTAL: 152 mg/dL (ref 100–199)
HDL: 42 mg/dL (ref >39)
LDL CALC: 94 mg/dL (ref 0–99)
TRIGLYCERIDES: 81 mg/dL (ref 0–149)
VLDL CHOLESTEROL CAL: 16 mg/dL (ref 5–40)

## 2017-09-18 LAB — COMPREHENSIVE METABOLIC PANEL
ALBUMIN: 3.7 g/dL (ref 3.5–4.8)
ALK PHOS: 62 IU/L (ref 39–117)
ALT: 18 IU/L (ref 0–44)
AST: 21 IU/L (ref 0–40)
Albumin/Globulin Ratio: 1.4 (ref 1.2–2.2)
BUN/Creatinine Ratio: 20 (ref 10–24)
BUN: 29 mg/dL — AB (ref 8–27)
Bilirubin Total: 0.5 mg/dL (ref 0.0–1.2)
CALCIUM: 8.9 mg/dL (ref 8.6–10.2)
CO2: 23 mmol/L (ref 20–29)
CREATININE: 1.43 mg/dL — AB (ref 0.76–1.27)
Chloride: 101 mmol/L (ref 96–106)
GFR calc Af Amer: 56 mL/min/{1.73_m2} — ABNORMAL LOW (ref >59)
GFR, EST NON AFRICAN AMERICAN: 48 mL/min/{1.73_m2} — AB (ref >59)
GLOBULIN, TOTAL: 2.6 g/dL (ref 1.5–4.5)
GLUCOSE: 89 mg/dL (ref 65–99)
Potassium: 3.9 mmol/L (ref 3.5–5.2)
SODIUM: 140 mmol/L (ref 134–144)
Total Protein: 6.3 g/dL (ref 6.0–8.5)

## 2017-09-18 LAB — TSH: TSH: 2.93 u[IU]/mL (ref 0.450–4.500)

## 2017-09-19 ENCOUNTER — Telehealth: Payer: Self-pay

## 2017-09-19 NOTE — Telephone Encounter (Signed)
Pt advised.  He will call in two months for a lab sheet.   Thanks,   -Mickel Baas

## 2017-09-19 NOTE — Telephone Encounter (Signed)
-----   Message from Margo Common, [place] sent at 09/19/2017  1:53 PM EDT ----- All blood tests normal except Creatinine elevated and GFR low. Drink plenty of water and recheck level in 2 months. If not settling down, may need referral to nephrologist.

## 2017-12-19 DIAGNOSIS — D2261 Melanocytic nevi of right upper limb, including shoulder: Secondary | ICD-10-CM | POA: Diagnosis not present

## 2017-12-19 DIAGNOSIS — L570 Actinic keratosis: Secondary | ICD-10-CM | POA: Diagnosis not present

## 2017-12-19 DIAGNOSIS — D2272 Melanocytic nevi of left lower limb, including hip: Secondary | ICD-10-CM | POA: Diagnosis not present

## 2017-12-19 DIAGNOSIS — D225 Melanocytic nevi of trunk: Secondary | ICD-10-CM | POA: Diagnosis not present

## 2017-12-19 DIAGNOSIS — D2262 Melanocytic nevi of left upper limb, including shoulder: Secondary | ICD-10-CM | POA: Diagnosis not present

## 2017-12-19 DIAGNOSIS — L821 Other seborrheic keratosis: Secondary | ICD-10-CM | POA: Diagnosis not present

## 2017-12-19 DIAGNOSIS — D2271 Melanocytic nevi of right lower limb, including hip: Secondary | ICD-10-CM | POA: Diagnosis not present

## 2018-01-02 DIAGNOSIS — H524 Presbyopia: Secondary | ICD-10-CM | POA: Diagnosis not present

## 2018-02-18 DIAGNOSIS — H2513 Age-related nuclear cataract, bilateral: Secondary | ICD-10-CM | POA: Diagnosis not present

## 2018-03-11 DIAGNOSIS — H2512 Age-related nuclear cataract, left eye: Secondary | ICD-10-CM | POA: Diagnosis not present

## 2018-03-11 DIAGNOSIS — I10 Essential (primary) hypertension: Secondary | ICD-10-CM | POA: Diagnosis not present

## 2018-03-12 ENCOUNTER — Encounter: Payer: Self-pay | Admitting: *Deleted

## 2018-03-26 ENCOUNTER — Ambulatory Visit: Admit: 2018-03-26 | Payer: Self-pay

## 2018-03-26 HISTORY — DX: Edema, unspecified: R60.9

## 2018-03-26 HISTORY — DX: Unspecified osteoarthritis, unspecified site: M19.90

## 2018-03-26 HISTORY — DX: Malignant (primary) neoplasm, unspecified: C80.1

## 2018-03-26 SURGERY — PHACOEMULSIFICATION, CATARACT, WITH IOL INSERTION
Anesthesia: Choice | Laterality: Left

## 2018-04-04 ENCOUNTER — Encounter: Payer: Self-pay | Admitting: Family Medicine

## 2018-07-11 ENCOUNTER — Other Ambulatory Visit: Payer: Self-pay | Admitting: Family Medicine

## 2018-07-23 ENCOUNTER — Ambulatory Visit: Payer: Self-pay | Admitting: Ophthalmology

## 2018-07-23 DIAGNOSIS — I10 Essential (primary) hypertension: Secondary | ICD-10-CM | POA: Diagnosis not present

## 2018-07-25 ENCOUNTER — Other Ambulatory Visit: Payer: Self-pay

## 2018-07-25 ENCOUNTER — Other Ambulatory Visit
Admission: RE | Admit: 2018-07-25 | Discharge: 2018-07-25 | Disposition: A | Discharge status: Home / Self Care | Payer: Medicare HMO | Source: Ambulatory Visit | Attending: Ophthalmology | Admitting: Ophthalmology

## 2018-07-25 DIAGNOSIS — Z1159 Encounter for screening for other viral diseases: Secondary | ICD-10-CM | POA: Insufficient documentation

## 2018-07-26 LAB — SARS CORONAVIRUS 2 (TAT 6-24 HRS): SARS Coronavirus 2: NEGATIVE

## 2018-07-28 ENCOUNTER — Other Ambulatory Visit: Payer: Self-pay

## 2018-07-30 ENCOUNTER — Encounter: Payer: Self-pay | Admitting: Anesthesiology

## 2018-07-31 ENCOUNTER — Encounter: Payer: Self-pay | Admitting: Anesthesiology

## 2018-07-31 ENCOUNTER — Encounter: Payer: Self-pay | Admitting: *Deleted

## 2018-07-31 ENCOUNTER — Ambulatory Visit: Payer: Self-pay | Admitting: Anesthesiology

## 2018-07-31 ENCOUNTER — Other Ambulatory Visit: Payer: Self-pay

## 2018-07-31 ENCOUNTER — Ambulatory Visit
Admission: AD | Admit: 2018-07-31 | Discharge: 2018-07-31 | Disposition: A | Discharge status: Home / Self Care | Payer: Medicare HMO | Attending: Ophthalmology | Admitting: Ophthalmology

## 2018-07-31 ENCOUNTER — Encounter
Admission: AD | Disposition: A | Discharge status: Home / Self Care | Payer: Self-pay | Source: Home / Self Care | Attending: Ophthalmology

## 2018-07-31 DIAGNOSIS — K279 Peptic ulcer, site unspecified, unspecified as acute or chronic, without hemorrhage or perforation: Secondary | ICD-10-CM | POA: Insufficient documentation

## 2018-07-31 DIAGNOSIS — Z7982 Long term (current) use of aspirin: Secondary | ICD-10-CM | POA: Insufficient documentation

## 2018-07-31 DIAGNOSIS — H2512 Age-related nuclear cataract, left eye: Secondary | ICD-10-CM | POA: Diagnosis not present

## 2018-07-31 DIAGNOSIS — I10 Essential (primary) hypertension: Secondary | ICD-10-CM | POA: Diagnosis not present

## 2018-07-31 DIAGNOSIS — M1990 Unspecified osteoarthritis, unspecified site: Secondary | ICD-10-CM | POA: Diagnosis not present

## 2018-07-31 DIAGNOSIS — H2511 Age-related nuclear cataract, right eye: Secondary | ICD-10-CM | POA: Diagnosis not present

## 2018-07-31 DIAGNOSIS — Z79899 Other long term (current) drug therapy: Secondary | ICD-10-CM | POA: Insufficient documentation

## 2018-07-31 DIAGNOSIS — N183 Chronic kidney disease, stage 3 (moderate): Secondary | ICD-10-CM | POA: Diagnosis not present

## 2018-07-31 DIAGNOSIS — I129 Hypertensive chronic kidney disease with stage 1 through stage 4 chronic kidney disease, or unspecified chronic kidney disease: Secondary | ICD-10-CM | POA: Diagnosis not present

## 2018-07-31 HISTORY — PX: CATARACT EXTRACTION W/PHACO: SHX586

## 2018-07-31 SURGERY — PHACOEMULSIFICATION, CATARACT, WITH IOL INSERTION
Anesthesia: Monitor Anesthesia Care | Site: Eye | Laterality: Left

## 2018-07-31 MED ORDER — DORZOLAMIDE HCL-TIMOLOL MAL 2-0.5 % OP SOLN
OPHTHALMIC | Status: DC | PRN
  Administered 2018-07-31: 1 [drp] via OPHTHALMIC

## 2018-07-31 MED ORDER — SODIUM CHLORIDE 0.9 % IV SOLN
INTRAVENOUS | Status: DC
  Administered 2018-07-31: 08:00:00 via INTRAVENOUS

## 2018-07-31 MED ORDER — NA HYALUR & NA CHOND-NA HYALUR 0.55-0.5 ML IO KIT
PACK | INTRAOCULAR | Status: DC | PRN
  Administered 2018-07-31: 1 via OPHTHALMIC

## 2018-07-31 MED ORDER — GLYCOPYRROLATE 0.2 MG/ML IJ SOLN
INTRAMUSCULAR | Status: AC
  Filled 2018-07-31: qty 1

## 2018-07-31 MED ORDER — MOXIFLOXACIN HCL 0.5 % OP SOLN
OPHTHALMIC | Status: DC | PRN
  Administered 2018-07-31: 0.2 mL via OPHTHALMIC

## 2018-07-31 MED ORDER — EPINEPHRINE PF 1 MG/ML IJ SOLN
INTRAOCULAR | Status: DC | PRN
  Administered 2018-07-31: 09:00:00 1 mL via OPHTHALMIC

## 2018-07-31 MED ORDER — LIDOCAINE HCL (PF) 4 % IJ SOLN
INTRAOCULAR | Status: DC | PRN
  Administered 2018-07-31: 09:00:00 4 mL via OPHTHALMIC

## 2018-07-31 MED ORDER — MOXIFLOXACIN HCL 0.5 % OP SOLN
OPHTHALMIC | Status: AC
  Filled 2018-07-31: qty 3

## 2018-07-31 MED ORDER — TETRACAINE HCL 0.5 % OP SOLN
OPHTHALMIC | Status: AC
  Administered 2018-07-31: 07:00:00 1 [drp] via OPHTHALMIC
  Filled 2018-07-31: qty 4

## 2018-07-31 MED ORDER — FENTANYL CITRATE (PF) 100 MCG/2ML IJ SOLN
INTRAMUSCULAR | Status: DC | PRN
  Administered 2018-07-31: 50 ug via INTRAVENOUS

## 2018-07-31 MED ORDER — MIDAZOLAM HCL 2 MG/2ML IJ SOLN
INTRAMUSCULAR | Status: DC | PRN
  Administered 2018-07-31 (×2): 1 mg via INTRAVENOUS

## 2018-07-31 MED ORDER — MOXIFLOXACIN HCL 0.5 % OP SOLN: 1.0000 [drp] | OPHTHALMIC | Status: DC | PRN

## 2018-07-31 MED ORDER — NA CHONDROIT SULF-NA HYALURON 40-17 MG/ML IO SOLN
INTRAOCULAR | Status: DC | PRN
  Administered 2018-07-31: 1 mL via INTRAOCULAR

## 2018-07-31 MED ORDER — GLYCOPYRROLATE 0.2 MG/ML IJ SOLN
INTRAMUSCULAR | Status: DC | PRN
  Administered 2018-07-31: 0.2 mg via INTRAVENOUS

## 2018-07-31 MED ORDER — POVIDONE-IODINE 5 % OP SOLN
OPHTHALMIC | Status: DC | PRN
  Administered 2018-07-31: 1 via OPHTHALMIC

## 2018-07-31 MED ORDER — TRYPAN BLUE 0.06 % OP SOLN
OPHTHALMIC | Status: DC | PRN
  Administered 2018-07-31: 0.5 mL via INTRAOCULAR

## 2018-07-31 MED ORDER — CARBACHOL 0.01 % IO SOLN
INTRAOCULAR | Status: DC | PRN
  Administered 2018-07-31: 0.5 mL via INTRAOCULAR

## 2018-07-31 MED ORDER — ARMC OPHTHALMIC DILATING DROPS
OPHTHALMIC | Status: AC
  Filled 2018-07-31: qty 0.5

## 2018-07-31 MED ORDER — ARMC OPHTHALMIC DILATING DROPS
OPHTHALMIC | Status: AC
  Administered 2018-07-31: 1 via OPHTHALMIC
  Filled 2018-07-31: qty 0.5

## 2018-07-31 MED ORDER — TETRACAINE HCL 0.5 % OP SOLN
1.0000 [drp] | OPHTHALMIC | Status: AC | PRN
  Administered 2018-07-31 (×2): 1 [drp] via OPHTHALMIC

## 2018-07-31 MED ORDER — ARMC OPHTHALMIC DILATING DROPS
1.0000 "application " | OPHTHALMIC | Status: AC
  Administered 2018-07-31 (×3): 1 via OPHTHALMIC

## 2018-07-31 MED ORDER — MIDAZOLAM HCL 2 MG/2ML IJ SOLN
INTRAMUSCULAR | Status: AC
  Filled 2018-07-31: qty 2

## 2018-07-31 MED ORDER — FENTANYL CITRATE (PF) 100 MCG/2ML IJ SOLN
INTRAMUSCULAR | Status: AC
  Filled 2018-07-31: qty 2

## 2018-07-31 SURGICAL SUPPLY — 19 items
DISSECTOR HYDRO NUCLEUS 50X22 (MISCELLANEOUS) ×12 IMPLANT
DRSG TEGADERM 2-3/8X2-3/4 SM (GAUZE/BANDAGES/DRESSINGS) ×3 IMPLANT
GLOVE BIOGEL M 6.5 STRL (GLOVE) ×3 IMPLANT
GOWN STRL REUS W/ TWL LRG LVL3 (GOWN DISPOSABLE) ×1 IMPLANT
GOWN STRL REUS W/ TWL XL LVL3 (GOWN DISPOSABLE) ×1 IMPLANT
GOWN STRL REUS W/TWL LRG LVL3 (GOWN DISPOSABLE) ×2
GOWN STRL REUS W/TWL XL LVL3 (GOWN DISPOSABLE) ×2
KNIFE 45D UP 2.3 (MISCELLANEOUS) ×3 IMPLANT
LABEL CATARACT MEDS ST (LABEL) ×3 IMPLANT
LENS IOL ACRYSOF IQ 21.5 (Intraocular Lens) ×2 IMPLANT
NDL CAPSULORHEX 25GA (NEEDLE) IMPLANT
NEEDLE CAPSULORHEX 25GA (NEEDLE) ×3 IMPLANT
PACK CATARACT (MISCELLANEOUS) ×3 IMPLANT
PACK CATARACT KING (MISCELLANEOUS) ×3 IMPLANT
PACK EYE AFTER SURG (MISCELLANEOUS) ×3 IMPLANT
SOL BSS BAG (MISCELLANEOUS) ×3
SOLUTION BSS BAG (MISCELLANEOUS) ×1 IMPLANT
WATER STERILE IRR 250ML POUR (IV SOLUTION) ×3 IMPLANT
WIPE NON LINTING 3.25X3.25 (MISCELLANEOUS) ×3 IMPLANT

## 2018-07-31 NOTE — Discharge Instructions (Addendum)
Eye Surgery Discharge Instructions  Expect mild scratchy sensation or mild soreness. DO NOT RUB YOUR EYE!  The day of surgery:  Minimal physical activity, but bed rest is not required  No reading, computer work, or close hand work  No bending, lifting, or straining.  May watch TV  For 24 hours:  No driving, legal decisions, or alcoholic beverages  Safety precautions  Eat anything you prefer: It is better to start with liquids, then soup then solid foods.  Solar shield eyeglasses should be worn for comfort in the sunlight/patch while sleeping  Resume all regular medications including aspirin or Coumadin if these were discontinued prior to surgery. You may shower, bathe, shave, or wash your hair. Tylenol may be taken for mild discomfort. Follow Dr. Sharene Butters eye drop instruction sheet as reviewed.  Call your doctor if you experience significant pain, nausea, or vomiting, fever > 101 or other signs of infection. [phone] or [phone] Specific instructions:  Follow-up Information    Marchia Meiers, MD Follow up.   Specialty: Ophthalmology Why: 08/01/18 @ 8:50 am  Contact information: [facility] [place] [address] [phone]

## 2018-07-31 NOTE — H&P (Signed)
   I have reviewed the patient's H&P and agree with its findings. There have been no interval changes.  Daissy Yerian MD Ophthalmology 

## 2018-07-31 NOTE — Anesthesia Post-op Follow-up Note (Signed)
Anesthesia QCDR form completed.        

## 2018-07-31 NOTE — Op Note (Signed)
PREOPERATIVE DIAGNOSIS:  Nuclear sclerotic cataract of the LEFT eye.   POSTOPERATIVE DIAGNOSIS:  Nuclear sclerotic cataract of the LEFT eye.   OPERATIVE PROCEDURE: Cataract surgery OS   SURGEON:  Marchia Meiers, MD.   ANESTHESIA:  Anesthesiologist: Gunnar Bulla, MD CRNA: Rolla Plate, CRNA; Caryl Asp, CRNA  1.      Managed anesthesia care. 2.     0.42ml of Shugarcaine was instilled following the paracentesis   COMPLICATIONS:  None.   TECHNIQUE:   Divide and conquer   DESCRIPTION OF PROCEDURE:  The patient was examined and consented in the preoperative holding area where the aforementioned topical anesthesia was applied to the LEFT eye and then brought back to the Operating Room where the left eye was prepped and draped in the usual sterile ophthalmic fashion and a lid speculum was placed. A paracentesis was created with the side port blade, the anterior chamber was washed out with trypan blue to stain the anterior capsule, and the anterior chamber was filled with viscoelastic. A near clear corneal incision was performed with the steel keratome. A continuous curvilinear capsulorrhexis was performed with a cystotome followed by the capsulorrhexis forceps. Hydrodissection and hydrodelineation were carried out with BSS on a blunt cannula. The lens was removed in a divide and conquer  technique and the remaining cortical material was removed with the irrigation-aspiration handpiece. The capsular bag was inflated with viscoelastic and the lens was placed in the capsular bag without complication. The remaining viscoelastic was removed from the eye with the irrigation-aspiration handpiece. The wounds were hydrated. The anterior chamber was flushed and the eye was inflated to physiologic pressure. 0.43ml Vigamox was placed in the anterior chamber. The wounds were found to be water tight. The eye was dressed with Vigamox. The patient was given protective glasses to wear throughout the day and a  shield with which to sleep tonight. The patient was also given drops with which to begin a drop regimen today and will follow-up with me in one day. Implant Name Type Inv. Item Serial No. Manufacturer Lot No. LRB No. Used Action  LENS IOL ACRYSOF IQ 21.5 - [id number] Intraocular Lens LENS IOL ACRYSOF IQ 21.5 [id number] [employer]CON  Left 1 Implanted    Procedure(s) with comments: CATARACT EXTRACTION PHACO AND INTRAOCULAR LENS PLACEMENT (IOC) (Left) - [place] 00:37.4 CDE 5.06 Fluid Pack Lot # [id number]  Electronically signed: Marchia Meiers 07/31/2018 1:14 PM

## 2018-07-31 NOTE — Anesthesia Postprocedure Evaluation (Signed)
Anesthesia Post Note  Patient: Kenneth Collins  Procedure(s) Performed: CATARACT EXTRACTION PHACO AND INTRAOCULAR LENS PLACEMENT (IOC) (Left Eye)  Patient location during evaluation: PACU Anesthesia Type: MAC Level of consciousness: awake and alert Pain management: pain level controlled Vital Signs Assessment: post-procedure vital signs reviewed and stable Respiratory status: spontaneous breathing, nonlabored ventilation, respiratory function stable and patient connected to nasal cannula oxygen Cardiovascular status: stable and blood pressure returned to baseline Postop Assessment: no apparent nausea or vomiting Anesthetic complications: no     Last Vitals:  Vitals:   07/31/18 0714 07/31/18 0914  BP: (!) 179/76 134/67  Pulse: (!) 52 (!) 52  Resp: 16 16  Temp: 36.4 C 36.7 C  SpO2: 100% 100%    Last Pain:  Vitals:   07/31/18 0914  TempSrc: Oral  PainSc: 0-No pain                 [name]

## 2018-07-31 NOTE — Transfer of Care (Signed)
Immediate Anesthesia Transfer of Care Note  Patient: [name]  Procedure(s) Performed: CATARACT EXTRACTION PHACO AND INTRAOCULAR LENS PLACEMENT (IOC) (Left Eye)  Patient Location: PACU  Anesthesia Type:MAC  Level of Consciousness: awake, alert  and oriented  Airway & Oxygen Therapy: Patient Spontanous Breathing  Post-op Assessment: Report given to RN, Post -op Vital signs reviewed and stable and Patient moving all extremities X 4  Post vital signs: Reviewed and stable  Last Vitals:  Vitals Value Taken Time  BP 134/67 07/31/18 0914  Temp 36.7 C 07/31/18 0914  Pulse 52 07/31/18 0914  Resp 16 07/31/18 0914  SpO2 100 % 07/31/18 0914    Last Pain:  Vitals:   07/31/18 0914  TempSrc: Oral  PainSc: 0-No pain         Complications: No apparent anesthesia complications

## 2018-07-31 NOTE — Anesthesia Preprocedure Evaluation (Addendum)
Anesthesia Evaluation  Patient identified by MRN, date of birth, ID band Patient awake    Reviewed: Allergy & Precautions, NPO status , Patient's Chart, lab work & pertinent test results, reviewed documented beta blocker date and time   Airway Mallampati: II  TM Distance: >3 FB     Dental  (+) Chipped   Pulmonary former smoker,           Cardiovascular hypertension, Pt. on medications and Pt. on home beta blockers      Neuro/Psych    GI/Hepatic PUD,   Endo/Other    Renal/GU Renal disease     Musculoskeletal  (+) Arthritis ,   Abdominal   Peds  Hematology   Anesthesia Other Findings   Reproductive/Obstetrics                            Anesthesia Physical Anesthesia Plan  ASA: III  Anesthesia Plan: MAC   Post-op Pain Management:    Induction: Intravenous  PONV Risk Score and Plan:   Airway Management Planned:   Additional Equipment:   Intra-op Plan:   Post-operative Plan:   Informed Consent: I have reviewed the patients History and Physical, chart, labs and discussed the procedure including the risks, benefits and alternatives for the proposed anesthesia with the patient or authorized representative who has indicated his/her understanding and acceptance.       Plan Discussed with: CRNA  Anesthesia Plan Comments:         Anesthesia Quick Evaluation

## 2018-08-12 ENCOUNTER — Ambulatory Visit: Payer: Medicare HMO

## 2018-08-12 ENCOUNTER — Encounter: Payer: Medicare HMO | Admitting: Family Medicine

## 2018-08-13 ENCOUNTER — Encounter: Payer: Self-pay | Admitting: Family Medicine

## 2018-08-18 DIAGNOSIS — H2511 Age-related nuclear cataract, right eye: Secondary | ICD-10-CM | POA: Diagnosis not present

## 2018-08-25 ENCOUNTER — Other Ambulatory Visit
Admission: RE | Admit: 2018-08-25 | Discharge: 2018-08-25 | Disposition: A | Discharge status: Home / Self Care | Payer: Medicare HMO | Source: Ambulatory Visit | Attending: Ophthalmology | Admitting: Ophthalmology

## 2018-08-25 ENCOUNTER — Encounter: Payer: Self-pay | Admitting: Family Medicine

## 2018-08-25 ENCOUNTER — Ambulatory Visit (INDEPENDENT_AMBULATORY_CARE_PROVIDER_SITE_OTHER): Payer: Medicare HMO | Admitting: Family Medicine

## 2018-08-25 ENCOUNTER — Other Ambulatory Visit: Payer: Self-pay

## 2018-08-25 VITALS — BP 124/78 | HR 51 | Temp 97.8°F | Resp 16 | Wt 193.8 lb

## 2018-08-25 DIAGNOSIS — I10 Essential (primary) hypertension: Secondary | ICD-10-CM

## 2018-08-25 DIAGNOSIS — Z01812 Encounter for preprocedural laboratory examination: Secondary | ICD-10-CM | POA: Insufficient documentation

## 2018-08-25 DIAGNOSIS — M5126 Other intervertebral disc displacement, lumbar region: Secondary | ICD-10-CM

## 2018-08-25 DIAGNOSIS — Z20828 Contact with and (suspected) exposure to other viral communicable diseases: Secondary | ICD-10-CM | POA: Insufficient documentation

## 2018-08-25 LAB — SARS CORONAVIRUS 2 (TAT 6-24 HRS): SARS Coronavirus 2: NEGATIVE

## 2018-08-25 NOTE — Progress Notes (Signed)
Patient: Kenneth Collins Male    DOB: 05/17/1945   73 y.o.   MRN: [id number] Visit Date: 08/25/2018  Today's Provider: Vernie Murders, PA   Chief Complaint  Patient presents with  . Advice Only   Subjective:     HPI  Patient comes in office today stating that he would like to discuss with provider about returning back to his regular exercise routine. Patient states that he will need a letter today stating that he is fit to exercise at the [facility] here in [place].   Past Medical History:  Diagnosis Date  . Arthritis   . Cancer ([place])    SKIN  . Diverticulitis   . Edema    FEET/ANKLES  . Hypertension    Past Surgical History:  Procedure Laterality Date  . BACK SURGERY    . CATARACT EXTRACTION W/PHACO Left 07/31/2018   Procedure: CATARACT EXTRACTION PHACO AND INTRAOCULAR LENS PLACEMENT (IOC);  Surgeon: Marchia Meiers, MD;  Location: [facility] ORS;  Service: Ophthalmology;  Laterality: Left;  [place] 00:37.4 CDE 5.06 Fluid Pack Lot # [id number]  . COLONOSCOPY WITH PROPOFOL N/A 09/20/2015   Procedure: COLONOSCOPY WITH PROPOFOL;  Surgeon: [name], MD;  Location: [facility] ENDOSCOPY;  Service: Endoscopy;  Laterality: N/A;  . MICRODISCECTOMY LUMBAR  03/17/2013  . VASECTOMY     Family History  Problem Relation Age of Onset  . Heart disease Father   . Pneumonia Mother   . Cancer Maternal Uncle    No Known Allergies  Current Outpatient Medications:  .  aspirin EC 81 MG tablet, Take 81 mg by mouth daily., Disp: , Rfl:  .  atenolol-chlorthalidone (TENORETIC) 50-25 MG tablet, TAKE 1 TABLET EVERY DAY (Patient taking differently: Take 1 tablet by mouth daily. ), Disp: 90 tablet, Rfl: 3 .  brimonidine-timolol (COMBIGAN) 0.2-0.5 % ophthalmic solution, Place 1 drop into the left eye 2 (two) times daily., Disp: , Rfl:  .  ibuprofen (ADVIL) 200 MG tablet, Take 200-400 mg by mouth every 8 (eight) hours as needed (pain.)., Disp: , Rfl:  .  NON FORMULARY, Place 1 drop into the left eye daily.  Pred-Gati-Brom (Prednisolone Acetate/Gatifloxacin/Bromfenac), Disp: , Rfl:   Review of Systems  Constitutional: Negative.   HENT: Negative.   Respiratory: Negative.   Cardiovascular: Negative.   Gastrointestinal: Negative.   Genitourinary: Negative.   Musculoskeletal: Negative.   Neurological: Negative.   Psychiatric/Behavioral: Negative.     Social History   Tobacco Use  . Smoking status: Former Smoker    Types: Cigarettes  . Smokeless tobacco: Never Used  . Tobacco comment: > 50 years ago  Substance Use Topics  . Alcohol use: Not Currently    Alcohol/week: 0.0 standard drinks    Comment: 0-2 glasses of wine a month.      Objective:   BP 124/78   Pulse (!) 51   Temp 97.8 F (36.6 C) (Oral)   Resp 16   Wt 193 lb 12.8 oz (87.9 kg)   BMI 30.35 kg/m  Vitals:   08/25/18 0847  BP: 124/78  Pulse: (!) 51  Resp: 16  Temp: 97.8 F (36.6 C)  TempSrc: Oral  Weight: 193 lb 12.8 oz (87.9 kg)   Physical Exam Constitutional:      General: He is not in acute distress.    Appearance: He is well-developed.  HENT:     Head: Normocephalic and atraumatic.     Right Ear: Hearing normal.     Left Ear: Hearing normal.  Nose: Nose normal.  Eyes:     General: Lids are normal. No scleral icterus.       Right eye: No discharge.        Left eye: No discharge.     Conjunctiva/sclera: Conjunctivae normal.  Neck:     Musculoskeletal: Neck supple.  Cardiovascular:     Rate and Rhythm: Regular rhythm.     Pulses: Normal pulses.     Heart sounds: Normal heart sounds.  Pulmonary:     Effort: Pulmonary effort is normal. No respiratory distress.  Musculoskeletal: Normal range of motion.        General: No tenderness or deformity.  Skin:    Findings: No lesion or rash.  Neurological:     Mental Status: He is alert and oriented to person, place, and time.     Deep Tendon Reflexes: Reflexes normal.  Psychiatric:        Speech: Speech normal.        Behavior: Behavior normal.         Thought Content: Thought content normal.       Assessment & Plan    1. Essential (primary) hypertension Well controlled BP and continues Tenoretic 50-25 mg qd without side effects. Due for medicare wellness exam in the next 2-3 months. Will get follow up labs. May resume exercise program at the [facility] as long as he maintains COVID restrictions. Note written to the [facility] to notify them of his good general health and he does not have any COVID symptoms (actually had a negative test on 07-25-18 prior to left cataract surgery and getting another test today for the right cataract surgery in 4 days - has maintained isolation this week in anticipation of surgery). - CBC with Differential/Platelet - Comprehensive metabolic panel - Lipid panel - TSH  2. Displacement of lumbar intervertebral disc without myelopathy Stable and well healed scars from laminectomy in 2015. No pain or neuropathy today. Good ROM throughout extremities with normal DTR's. Recheck prn.      Vernie Murders, PA  [facility] Fritzi Mandes [place] as a Education administrator for [employer].,have documented all relevant documentation on the behalf of [employer],as directed by  [employer] while in the presence of [employer], [place].

## 2018-08-28 ENCOUNTER — Ambulatory Visit
Admission: RE | Admit: 2018-08-28 | Discharge: 2018-08-28 | Disposition: A | Discharge status: Home / Self Care | Payer: Medicare HMO | Attending: Ophthalmology | Admitting: Ophthalmology

## 2018-08-28 ENCOUNTER — Encounter
Admission: RE | Disposition: A | Discharge status: Home / Self Care | Payer: Self-pay | Source: Home / Self Care | Attending: Ophthalmology

## 2018-08-28 ENCOUNTER — Ambulatory Visit: Payer: Medicare HMO | Admitting: Certified Registered Nurse Anesthetist

## 2018-08-28 DIAGNOSIS — Z9849 Cataract extraction status, unspecified eye: Secondary | ICD-10-CM | POA: Diagnosis not present

## 2018-08-28 DIAGNOSIS — H2511 Age-related nuclear cataract, right eye: Secondary | ICD-10-CM | POA: Insufficient documentation

## 2018-08-28 DIAGNOSIS — Z85828 Personal history of other malignant neoplasm of skin: Secondary | ICD-10-CM | POA: Insufficient documentation

## 2018-08-28 DIAGNOSIS — I10 Essential (primary) hypertension: Secondary | ICD-10-CM | POA: Diagnosis not present

## 2018-08-28 DIAGNOSIS — Z87891 Personal history of nicotine dependence: Secondary | ICD-10-CM | POA: Diagnosis not present

## 2018-08-28 DIAGNOSIS — Z79899 Other long term (current) drug therapy: Secondary | ICD-10-CM | POA: Diagnosis not present

## 2018-08-28 DIAGNOSIS — I129 Hypertensive chronic kidney disease with stage 1 through stage 4 chronic kidney disease, or unspecified chronic kidney disease: Secondary | ICD-10-CM | POA: Diagnosis not present

## 2018-08-28 DIAGNOSIS — Z7982 Long term (current) use of aspirin: Secondary | ICD-10-CM | POA: Diagnosis not present

## 2018-08-28 DIAGNOSIS — K5790 Diverticulosis of intestine, part unspecified, without perforation or abscess without bleeding: Secondary | ICD-10-CM | POA: Diagnosis not present

## 2018-08-28 DIAGNOSIS — H9190 Unspecified hearing loss, unspecified ear: Secondary | ICD-10-CM | POA: Diagnosis not present

## 2018-08-28 DIAGNOSIS — M1990 Unspecified osteoarthritis, unspecified site: Secondary | ICD-10-CM | POA: Diagnosis not present

## 2018-08-28 DIAGNOSIS — N189 Chronic kidney disease, unspecified: Secondary | ICD-10-CM | POA: Diagnosis not present

## 2018-08-28 HISTORY — DX: Diverticulitis of intestine, part unspecified, without perforation or abscess without bleeding: K57.92

## 2018-08-28 HISTORY — PX: CATARACT EXTRACTION W/PHACO: SHX586

## 2018-08-28 SURGERY — PHACOEMULSIFICATION, CATARACT, WITH IOL INSERTION
Anesthesia: Monitor Anesthesia Care | Site: Eye | Laterality: Right

## 2018-08-28 MED ORDER — ARMC OPHTHALMIC DILATING DROPS
OPHTHALMIC | Status: AC
  Administered 2018-08-28: 1 via OPHTHALMIC
  Filled 2018-08-28: qty 0.5

## 2018-08-28 MED ORDER — POVIDONE-IODINE 5 % OP SOLN
OPHTHALMIC | Status: DC | PRN
  Administered 2018-08-28: 1 via OPHTHALMIC

## 2018-08-28 MED ORDER — TRYPAN BLUE 0.06 % OP SOLN
OPHTHALMIC | Status: DC | PRN
  Administered 2018-08-28: 0.5 mL via INTRAOCULAR

## 2018-08-28 MED ORDER — MIDAZOLAM HCL 2 MG/2ML IJ SOLN
INTRAMUSCULAR | Status: DC | PRN
  Administered 2018-08-28: 1 mg via INTRAVENOUS

## 2018-08-28 MED ORDER — LIDOCAINE HCL (PF) 4 % IJ SOLN
INTRAOCULAR | Status: DC | PRN
  Administered 2018-08-28: 09:00:00 4 mL via OPHTHALMIC

## 2018-08-28 MED ORDER — TETRACAINE HCL 0.5 % OP SOLN
OPHTHALMIC | Status: AC
  Administered 2018-08-28: 1 [drp] via OPHTHALMIC
  Filled 2018-08-28: qty 4

## 2018-08-28 MED ORDER — NA CHONDROIT SULF-NA HYALURON 40-17 MG/ML IO SOLN
INTRAOCULAR | Status: DC | PRN
  Administered 2018-08-28: 1 mL via INTRAOCULAR

## 2018-08-28 MED ORDER — GLYCOPYRROLATE 0.2 MG/ML IJ SOLN
INTRAMUSCULAR | Status: DC | PRN
  Administered 2018-08-28: 0.2 mg via INTRAVENOUS

## 2018-08-28 MED ORDER — CARBACHOL 0.01 % IO SOLN
INTRAOCULAR | Status: DC | PRN
  Administered 2018-08-28: 0.5 mL via INTRAOCULAR

## 2018-08-28 MED ORDER — DORZOLAMIDE HCL-TIMOLOL MAL 2-0.5 % OP SOLN
OPHTHALMIC | Status: DC | PRN
  Administered 2018-08-28: 1 [drp] via OPHTHALMIC

## 2018-08-28 MED ORDER — FENTANYL CITRATE (PF) 100 MCG/2ML IJ SOLN
INTRAMUSCULAR | Status: DC | PRN
  Administered 2018-08-28: 50 ug via INTRAVENOUS

## 2018-08-28 MED ORDER — MIDAZOLAM HCL 2 MG/2ML IJ SOLN
INTRAMUSCULAR | Status: AC
  Filled 2018-08-28: qty 2

## 2018-08-28 MED ORDER — FENTANYL CITRATE (PF) 100 MCG/2ML IJ SOLN
INTRAMUSCULAR | Status: AC
  Filled 2018-08-28: qty 2

## 2018-08-28 MED ORDER — ARMC OPHTHALMIC DILATING DROPS
1.0000 "application " | OPHTHALMIC | Status: AC
  Administered 2018-08-28 (×3): 1 via OPHTHALMIC

## 2018-08-28 MED ORDER — MOXIFLOXACIN HCL 0.5 % OP SOLN
OPHTHALMIC | Status: AC
  Filled 2018-08-28: qty 3

## 2018-08-28 MED ORDER — TETRACAINE HCL 0.5 % OP SOLN
1.0000 [drp] | Freq: Once | OPHTHALMIC | Status: AC
  Administered 2018-08-28 (×2): 1 [drp] via OPHTHALMIC

## 2018-08-28 MED ORDER — NA HYALUR & NA CHOND-NA HYALUR 0.55-0.5 ML IO KIT
PACK | INTRAOCULAR | Status: DC | PRN
  Administered 2018-08-28: 1 via OPHTHALMIC

## 2018-08-28 MED ORDER — SODIUM CHLORIDE 0.9 % IV SOLN
Freq: Once | INTRAVENOUS | Status: AC
  Administered 2018-08-28: 09:00:00 via INTRAVENOUS

## 2018-08-28 MED ORDER — MOXIFLOXACIN HCL 0.5 % OP SOLN
1.0000 [drp] | OPHTHALMIC | Status: DC | PRN
  Administered 2018-08-28: 09:00:00 1 [drp] via OPHTHALMIC
  Filled 2018-08-28: qty 3

## 2018-08-28 MED ORDER — MOXIFLOXACIN HCL 0.5 % OP SOLN
OPHTHALMIC | Status: DC | PRN
  Administered 2018-08-28: 0.2 mL via OPHTHALMIC

## 2018-08-28 SURGICAL SUPPLY — 17 items
DISSECTOR HYDRO NUCLEUS 50X22 (MISCELLANEOUS) ×12 IMPLANT
DRSG TEGADERM 2-3/8X2-3/4 SM (GAUZE/BANDAGES/DRESSINGS) ×3 IMPLANT
GLOVE BIOGEL M 6.5 STRL (GLOVE) ×3 IMPLANT
GOWN STRL REUS W/ TWL LRG LVL3 (GOWN DISPOSABLE) ×1 IMPLANT
GOWN STRL REUS W/ TWL XL LVL3 (GOWN DISPOSABLE) ×1 IMPLANT
GOWN STRL REUS W/TWL LRG LVL3 (GOWN DISPOSABLE) ×2
GOWN STRL REUS W/TWL XL LVL3 (GOWN DISPOSABLE) ×2
KNIFE 45D UP 2.3 (MISCELLANEOUS) ×3 IMPLANT
LABEL CATARACT MEDS ST (LABEL) ×3 IMPLANT
LENS IOL ACRYSOF IQ 21.5 (Intraocular Lens) ×2 IMPLANT
PACK CATARACT (MISCELLANEOUS) ×3 IMPLANT
PACK CATARACT KING (MISCELLANEOUS) ×3 IMPLANT
PACK EYE AFTER SURG (MISCELLANEOUS) ×3 IMPLANT
SOL BSS BAG (MISCELLANEOUS) ×3
SOLUTION BSS BAG (MISCELLANEOUS) ×1 IMPLANT
WATER STERILE IRR 250ML POUR (IV SOLUTION) ×3 IMPLANT
WIPE NON LINTING 3.25X3.25 (MISCELLANEOUS) ×3 IMPLANT

## 2018-08-28 NOTE — Transfer of Care (Signed)
Immediate Anesthesia Transfer of Care Note  Patient: Kenneth Collins  Procedure(s) Performed: CATARACT EXTRACTION PHACO AND INTRAOCULAR LENS PLACEMENT (IOC) (Right Eye)  Patient Location: PACU  Anesthesia Type:MAC  Level of Consciousness: awake, alert  and oriented  Airway & Oxygen Therapy: Patient Spontanous Breathing  Post-op Assessment: Report given to RN, Post -op Vital signs reviewed and stable and Patient moving all extremities X 4  Post vital signs: Reviewed and stable  Last Vitals:  Vitals Value Taken Time  BP 139/65 08/28/18 0956  Temp 36.6 C 08/28/18 0956  Pulse 50 08/28/18 0956  Resp 16 08/28/18 0956  SpO2 100 % 08/28/18 0956    Last Pain:  Vitals:   08/28/18 0956  TempSrc: Temporal  PainSc: 0-No pain         Complications: No apparent anesthesia complications

## 2018-08-28 NOTE — Op Note (Addendum)
  PREOPERATIVE DIAGNOSIS:  Nuclear sclerotic cataract of the RIGHT eye.   POSTOPERATIVE DIAGNOSIS:  Nuclear sclerotic cataract of the RIGHT eye.   OPERATIVE PROCEDURE: Cataract surgery OD   SURGEON:  Marchia Meiers, MD.   ANESTHESIA:  Anesthesiologist: Gunnar Fusi, MD CRNA: Caryl Asp, CRNA  1.      Managed anesthesia care. 2.     0.58ml of Shugarcaine was instilled following the paracentesis   COMPLICATIONS:  None.   TECHNIQUE:   Divide and conquer   DESCRIPTION OF PROCEDURE:  The patient was examined and consented in the preoperative holding area where the aforementioned topical anesthesia was applied to the RIGHT eye and then brought back to the Operating Room where the RIGHT eye was prepped and draped in the usual sterile ophthalmic fashion and a lid speculum was placed. A paracentesis was created with the side port blade, the anterior chamber was washed out with trypan blue to stain the anterior capsule, and the anterior chamber was filled with viscoelastic. A near clear corneal incision was performed with the steel keratome. A continuous curvilinear capsulorrhexis was performed with a cystotome followed by the capsulorrhexis forceps. Hydrodissection and hydrodelineation were carried out with BSS on a blunt cannula. The lens was removed in a divide and conquer  technique and the remaining cortical material was removed with the irrigation-aspiration handpiece. The capsular bag was inflated with viscoelastic and the lens was placed in the capsular bag without complication. The remaining viscoelastic was removed from the eye with the irrigation-aspiration handpiece. The wounds were hydrated. The anterior chamber was flushed and the eye was inflated to physiologic pressure. 0.42ml Vigamox was placed in the anterior chamber. The wounds were found to be water tight. The eye was dressed with Vigamox. The patient was given protective glasses to wear throughout the day and a shield with  which to sleep tonight. The patient was also given drops with which to begin a drop regimen today and will follow-up with me in one day. Implant Name Type Inv. Item Serial No. Manufacturer Lot No. LRB No. Used Action  LENS IOL ACRYSOF IQ 21.5 - [id number] Intraocular Lens LENS IOL ACRYSOF IQ 21.5 [id number] ALCON  Right 1 Implanted    Procedure(s) with comments: CATARACT EXTRACTION PHACO AND INTRAOCULAR LENS PLACEMENT (IOC) (Right) - [place] 00:49.3 CDE 6.95 Fluid Pack Lot # [id number]  Electronically signed: Marchia Meiers 08/28/2018 10:28 AM

## 2018-08-28 NOTE — Discharge Instructions (Addendum)
Eye Surgery Discharge Instructions  Expect mild scratchy sensation or mild soreness. DO NOT RUB YOUR EYE!  The day of surgery:  Minimal physical activity, but bed rest is not required  No reading, computer work, or close hand work  No bending, lifting, or straining.  May watch TV  For 24 hours:  No driving, legal decisions, or alcoholic beverages  Safety precautions  Eat anything you prefer: It is better to start with liquids, then soup then solid foods.  Solar shield eyeglasses should be worn for comfort in the sunlight/patch while sleeping  Resume all regular medications including aspirin or Coumadin if these were discontinued prior to surgery. You may shower, bathe, shave, or wash your hair. Tylenol may be taken for mild discomfort. FOLLOW EYE DROP INSTRUCTION SHEET AS REVIEWED.  Call your doctor if you experience significant pain, nausea, or vomiting, fever > 101 or other signs of infection. [phone] or [phone] Specific instructions:  Follow-up Information    Marchia Meiers, MD Follow up.   Specialty: Ophthalmology Why: 08-29-18 @ 1:50 pm  Contact information: [place] [address] [phone]

## 2018-08-28 NOTE — Anesthesia Post-op Follow-up Note (Signed)
Anesthesia QCDR form completed.        

## 2018-08-28 NOTE — Anesthesia Preprocedure Evaluation (Signed)
Anesthesia Evaluation  Patient identified by MRN, date of birth, ID band Patient awake    Reviewed: Allergy & Precautions, NPO status , Patient's Chart, lab work & pertinent test results  History of Anesthesia Complications Negative for: history of anesthetic complications  Airway Mallampati: III       Dental   Pulmonary neg sleep apnea, neg COPD, Not current smoker, former smoker,           Cardiovascular hypertension, Pt. on medications and Pt. on home beta blockers (-) Past MI and (-) CHF (-) dysrhythmias (-) Valvular Problems/Murmurs     Neuro/Psych neg Seizures    GI/Hepatic Neg liver ROS, PUD, neg GERD  ,  Endo/Other  neg diabetes  Renal/GU Renal InsufficiencyRenal disease     Musculoskeletal   Abdominal   Peds  Hematology   Anesthesia Other Findings   Reproductive/Obstetrics                             Anesthesia Physical Anesthesia Plan  ASA: III  Anesthesia Plan: MAC   Post-op Pain Management:    Induction: Intravenous  PONV Risk Score and Plan:   Airway Management Planned: Nasal Cannula  Additional Equipment:   Intra-op Plan:   Post-operative Plan:   Informed Consent: I have reviewed the patients History and Physical, chart, labs and discussed the procedure including the risks, benefits and alternatives for the proposed anesthesia with the patient or authorized representative who has indicated his/her understanding and acceptance.       Plan Discussed with:   Anesthesia Plan Comments:         Anesthesia Quick Evaluation

## 2018-08-28 NOTE — H&P (Addendum)
   I have reviewed the patient's H&P and agree with its findings. There have been no interval changes.  Lajarvis Italiano MD Ophthalmology 

## 2018-08-28 NOTE — Anesthesia Postprocedure Evaluation (Signed)
Anesthesia Post Note  Patient: Kenneth Collins  Procedure(s) Performed: CATARACT EXTRACTION PHACO AND INTRAOCULAR LENS PLACEMENT (IOC) (Right Eye)  Patient location during evaluation: Phase II Anesthesia Type: MAC Level of consciousness: awake and alert Pain management: pain level controlled Vital Signs Assessment: post-procedure vital signs reviewed and stable Respiratory status: spontaneous breathing, nonlabored ventilation, respiratory function stable and patient connected to nasal cannula oxygen Cardiovascular status: stable and blood pressure returned to baseline Postop Assessment: no apparent nausea or vomiting Anesthetic complications: no     Last Vitals:  Vitals:   08/28/18 0807 08/28/18 0956  BP: (!) 160/79 139/65  Pulse: (!) 51 (!) 50  Resp: 16 16  Temp: 36.6 C 36.6 C  SpO2: 100% 100%    Last Pain:  Vitals:   08/28/18 0956  TempSrc: Temporal  PainSc: 0-No pain                 Rosebud Poles C

## 2018-08-29 ENCOUNTER — Encounter: Payer: Self-pay | Admitting: Ophthalmology

## 2018-09-18 DIAGNOSIS — I10 Essential (primary) hypertension: Secondary | ICD-10-CM | POA: Diagnosis not present

## 2018-09-19 ENCOUNTER — Telehealth: Payer: Self-pay

## 2018-09-19 LAB — CBC WITH DIFFERENTIAL/PLATELET
Basophils Absolute: 0 10*3/uL (ref 0.0–0.2)
Basos: 0 %
EOS (ABSOLUTE): 0.2 10*3/uL (ref 0.0–0.4)
Eos: 3 %
Hematocrit: 43 % (ref 37.5–51.0)
Hemoglobin: 14.4 g/dL (ref 13.0–17.7)
Immature Grans (Abs): 0 10*3/uL (ref 0.0–0.1)
Immature Granulocytes: 0 %
Lymphocytes Absolute: 1.7 10*3/uL (ref 0.7–3.1)
Lymphs: 22 %
MCH: 30.4 pg (ref 26.6–33.0)
MCHC: 33.5 g/dL (ref 31.5–35.7)
MCV: 91 fL (ref 79–97)
Monocytes Absolute: 0.6 10*3/uL (ref 0.1–0.9)
Monocytes: 8 %
Neutrophils Absolute: 5.1 10*3/uL (ref 1.4–7.0)
Neutrophils: 67 %
Platelets: 214 10*3/uL (ref 150–450)
RBC: 4.74 x10E6/uL (ref 4.14–5.80)
RDW: 12.9 % (ref 11.6–15.4)
WBC: 7.7 10*3/uL (ref 3.4–10.8)

## 2018-09-19 LAB — COMPREHENSIVE METABOLIC PANEL
ALT: 13 IU/L (ref 0–44)
AST: 20 IU/L (ref 0–40)
Albumin/Globulin Ratio: 1.5 (ref 1.2–2.2)
Albumin: 3.8 g/dL (ref 3.7–4.7)
Alkaline Phosphatase: 60 IU/L (ref 39–117)
BUN/Creatinine Ratio: 16 (ref 10–24)
BUN: 23 mg/dL (ref 8–27)
Bilirubin Total: 0.5 mg/dL (ref 0.0–1.2)
CO2: 24 mmol/L (ref 20–29)
Calcium: 8.7 mg/dL (ref 8.6–10.2)
Chloride: 104 mmol/L (ref 96–106)
Creatinine, Ser: 1.48 mg/dL — ABNORMAL HIGH (ref 0.76–1.27)
GFR calc Af Amer: 53 mL/min/{1.73_m2} — ABNORMAL LOW (ref >59)
GFR calc non Af Amer: 46 mL/min/{1.73_m2} — ABNORMAL LOW (ref >59)
Globulin, Total: 2.5 g/dL (ref 1.5–4.5)
Glucose: 88 mg/dL (ref 65–99)
Potassium: 4.1 mmol/L (ref 3.5–5.2)
Sodium: 141 mmol/L (ref 134–144)
Total Protein: 6.3 g/dL (ref 6.0–8.5)

## 2018-09-19 LAB — LIPID PANEL
Chol/HDL Ratio: 3.8 ratio (ref 0.0–5.0)
Cholesterol, Total: 153 mg/dL (ref 100–199)
HDL: 40 mg/dL (ref >39)
LDL Chol Calc (NIH): 96 mg/dL (ref 0–99)
Triglycerides: 87 mg/dL (ref 0–149)
VLDL Cholesterol Cal: 17 mg/dL (ref 5–40)

## 2018-09-19 LAB — TSH: TSH: 2.6 u[IU]/mL (ref 0.450–4.500)

## 2018-09-19 NOTE — Telephone Encounter (Signed)
Advised 

## 2018-09-19 NOTE — Telephone Encounter (Signed)
-----   Message from [place], [place] sent at 09/19/2018  8:27 AM EDT ----- Blood counts, cholesterol and thyroid function normal. Kidney function still shows stress with slight increase in creatinine. Increase water intake and continue present BP medication. Recheck levels in 3 months to assess progress.

## 2018-09-19 NOTE — Telephone Encounter (Signed)
LMTCB ED 

## 2018-10-20 DIAGNOSIS — M4712 Other spondylosis with myelopathy, cervical region: Secondary | ICD-10-CM | POA: Diagnosis not present

## 2018-11-19 ENCOUNTER — Telehealth: Payer: Medicare HMO | Admitting: Nurse Practitioner

## 2018-11-19 DIAGNOSIS — R05 Cough: Secondary | ICD-10-CM | POA: Diagnosis not present

## 2018-11-19 DIAGNOSIS — R059 Cough, unspecified: Secondary | ICD-10-CM

## 2018-11-19 MED ORDER — BENZONATATE 100 MG PO CAPS: 100.0000 mg | ORAL_CAPSULE | Freq: Three times a day (TID) | ORAL | 0 refills | Status: DC | PRN

## 2018-11-19 NOTE — Progress Notes (Signed)
We are sorry that you are not feeling well.  Here is how we plan to help!  Based on your presentation I believe you most likely have A cough due to a virus.  This is called viral bronchitis and is best treated by rest, plenty of fluids and control of the cough.  You may use Ibuprofen or Tylenol as directed to help your symptoms.     In addition you may use A prescription cough medication called Tessalon Perles 100mg . You may take 1-2 capsules every 8 hours as needed for your cough.  If cough worsens you may want to have corona test doen. testing information is below From your responses in the eVisit questionnaire you describe inflammation in the upper respiratory tract which is causing a significant cough.  This is commonly called Bronchitis and has four common causes:    Allergies  Viral Infections  Acid Reflux  Bacterial Infection Allergies, viruses and acid reflux are treated by controlling symptoms or eliminating the cause. An example might be a cough caused by taking certain blood pressure medications. You stop the cough by changing the medication. Another example might be a cough caused by acid reflux. Controlling the reflux helps control the cough.  USE OF BRONCHODILATOR ("RESCUE") INHALERS: There is a risk from using your bronchodilator too frequently.  The risk is that over-reliance on a medication which only relaxes the muscles surrounding the breathing tubes can reduce the effectiveness of medications prescribed to reduce swelling and congestion of the tubes themselves.  Although you feel brief relief from the bronchodilator inhaler, your asthma may actually be worsening with the tubes becoming more swollen and filled with mucus.  This can delay other crucial treatments, such as oral steroid medications. If you need to use a bronchodilator inhaler daily, several times per day, you should discuss this with your provider.  There are probably better treatments that could be used to keep  your asthma under control.     HOME CARE . Only take medications as instructed by your medical team. . Complete the entire course of an antibiotic. . Drink plenty of fluids and get plenty of rest. . Avoid close contacts especially the very young and the elderly . Cover your mouth if you cough or cough into your sleeve. . Always remember to wash your hands . A steam or ultrasonic humidifier can help congestion.   GET HELP RIGHT AWAY IF: . You develop worsening fever. . You become short of breath . You cough up blood. . Your symptoms persist after you have completed your treatment plan MAKE SURE YOU   Understand these instructions.  Will watch your condition.  Will get help right away if you are not doing well or get worse.  Your e-visit answers were reviewed by a board certified advanced clinical practitioner to complete your personal care plan.  Depending on the condition, your plan could have included both over the counter or prescription medications. If there is a problem please reply  once you have received a response from your provider. Your safety is important to [place].  If you have drug allergies check your prescription carefully.    You can use MyChart to ask questions about today's visit, request a non-urgent call back, or ask for a work or school excuse for 24 hours related to this e-Visit. If it has been greater than 24 hours you will need to follow up with your provider, or enter a new e-Visit to address those concerns. You will  get an e-mail in the next two days asking about your experience.  I hope that your e-visit has been valuable and will speed your recovery. Thank you for using e-visits.  5-10 minutes spent reviewing and documenting in chart.

## 2018-11-25 ENCOUNTER — Telehealth: Payer: Self-pay | Admitting: Family Medicine

## 2018-11-25 ENCOUNTER — Other Ambulatory Visit: Payer: Self-pay | Admitting: Family Medicine

## 2018-11-25 MED ORDER — BENZONATATE 100 MG PO CAPS: 100.0000 mg | ORAL_CAPSULE | Freq: Three times a day (TID) | ORAL | 0 refills | Status: DC | PRN

## 2018-11-25 NOTE — Telephone Encounter (Signed)
Kenneth Collins, advised patient of below.  He states that his wife has tested positive for Covid and he is certain he has it too.  Advised that he should still get tested so they can do the contact tracing for him.

## 2018-11-25 NOTE — Telephone Encounter (Signed)
Acknowledged and AGREE.

## 2018-11-25 NOTE — Telephone Encounter (Signed)
He should be tested also if having a cough needing this medication. Schedule virtual visit and refill Tessalon Perles (benzonatate) 100 mg one capsule by mouth TID #21.

## 2018-11-25 NOTE — Telephone Encounter (Signed)
requesting refill for benzonatate (TESSALON PERLES) 100 MG capsule [[id number]]. Pt wife tested positive for covid. Please advise    [id number] [address], [place] [phone] (Phone) [phone] (Fax)

## 2019-01-05 DIAGNOSIS — L570 Actinic keratosis: Secondary | ICD-10-CM | POA: Diagnosis not present

## 2019-01-05 DIAGNOSIS — L821 Other seborrheic keratosis: Secondary | ICD-10-CM | POA: Diagnosis not present

## 2019-01-05 DIAGNOSIS — D2262 Melanocytic nevi of left upper limb, including shoulder: Secondary | ICD-10-CM | POA: Diagnosis not present

## 2019-01-05 DIAGNOSIS — X32XXXA Exposure to sunlight, initial encounter: Secondary | ICD-10-CM | POA: Diagnosis not present

## 2019-01-05 DIAGNOSIS — D2261 Melanocytic nevi of right upper limb, including shoulder: Secondary | ICD-10-CM | POA: Diagnosis not present

## 2019-01-05 DIAGNOSIS — Z85820 Personal history of malignant melanoma of skin: Secondary | ICD-10-CM | POA: Diagnosis not present

## 2019-01-05 DIAGNOSIS — D2271 Melanocytic nevi of right lower limb, including hip: Secondary | ICD-10-CM | POA: Diagnosis not present

## 2019-01-05 DIAGNOSIS — D485 Neoplasm of uncertain behavior of skin: Secondary | ICD-10-CM | POA: Diagnosis not present

## 2019-01-05 DIAGNOSIS — D225 Melanocytic nevi of trunk: Secondary | ICD-10-CM | POA: Diagnosis not present

## 2019-01-09 DIAGNOSIS — H43811 Vitreous degeneration, right eye: Secondary | ICD-10-CM | POA: Diagnosis not present

## 2019-02-11 NOTE — Progress Notes (Addendum)
Subjective:   Kenneth Collins is a 74 y.o. male who presents for Medicare Annual/Subsequent preventive examination.    This visit is being conducted through telemedicine due to the COVID-19 pandemic. This patient has given me verbal consent via doximity to conduct this visit, patient states they are participating from their home address. Some vital signs may be absent or patient reported.    Patient identification: identified by name, DOB, and current address  Review of Systems:  N/A  Cardiac Risk Factors include: advanced age (>12men, >43 women);male gender;hypertension     Objective:    Vitals: There were no vitals taken for this visit.  There is no height or weight on file to calculate BMI. Unable to obtain vitals due to visit being conducted via telephonically.   Advanced Directives 02/12/2019 08/08/2017 07/17/2016 09/20/2015 07/12/2015 06/29/2014  Does Patient Have a Medical Advance Directive? No Yes No No No No  Type of Advance Directive - Healthcare Power of [place];Living will - - - -  Copy of [place] in Chart? - No - copy requested - - - -  Would patient like information on creating a medical advance directive? No - Patient declined - No - Patient declined - - -    Tobacco Social History   Tobacco Use  Smoking Status Former Smoker   Types: Cigarettes  Smokeless Tobacco Never Used  Tobacco Comment   > 50 years ago     Counseling given: Not Answered Comment: > 50 years ago   Clinical Intake:  Pre-visit preparation completed: Yes  Pain : No/denies pain Pain Score: 0-No pain     Nutritional Risks: None Diabetes: No  How often do you need to have someone help you when you read instructions, pamphlets, or other written materials from your doctor or pharmacy?: 1 - Never  Interpreter Needed?: No  Information entered by :: [facility], LPN  Past Medical History:  Diagnosis Date   Arthritis    Cancer ([place])    SKIN   COVID-19    Diverticulitis    Edema    FEET/ANKLES   Hypertension    Past Surgical History:  Procedure Laterality Date   BACK SURGERY     CATARACT EXTRACTION W/PHACO Left 07/31/2018   Procedure: CATARACT EXTRACTION PHACO AND INTRAOCULAR LENS PLACEMENT ([place]);  Surgeon: Marchia Meiers, MD;  Location: [facility] ORS;  Service: Ophthalmology;  Laterality: Left;  [place] 00:37.4 CDE 5.06 Fluid Pack Lot # [id number]   CATARACT EXTRACTION W/PHACO Right 08/28/2018   Procedure: CATARACT EXTRACTION PHACO AND INTRAOCULAR LENS PLACEMENT ([facility]);  Surgeon: Marchia Meiers, MD;  Location: [facility] ORS;  Service: Ophthalmology;  Laterality: Right;  [place] 00:49.3 CDE 6.95 Fluid Pack Lot # [id number]   COLONOSCOPY WITH PROPOFOL N/A 09/20/2015   Procedure: COLONOSCOPY WITH PROPOFOL;  Surgeon: [name], MD;  Location: [facility] ENDOSCOPY;  Service: Endoscopy;  Laterality: N/A;   MICRODISCECTOMY LUMBAR  03/17/2013   VASECTOMY     Family History  Problem Relation Age of Onset   Heart disease Father    Pneumonia Mother    Cancer Maternal Uncle    Social History   Socioeconomic History   Marital status: Married    Spouse name: Not on file   Number of children: 2   Years of education: Not on file   Highest education level: 12th grade  Occupational History   Occupation: retired  Tobacco Use   Smoking status: Former Smoker    Types: Cigarettes   Smokeless tobacco: Never Used  Tobacco comment: > 50 years ago  Substance and Sexual Activity   Alcohol use: Not Currently    Alcohol/week: 0.0 standard drinks    Comment: 0-2 glasses of wine a month.    Drug use: No   Sexual activity: Not on file  Other Topics Concern   Not on file  Social History Narrative   Not on file   Social Determinants of Health   Financial Resource Strain: Low Risk    Difficulty of Paying Living Expenses: Not hard at all  Food Insecurity: No Food Insecurity   Worried About Running Out of Food in the Last Year: Never true   [place] in the Last Year:  Never true  Transportation Needs: No Transportation Needs   Lack of Transportation (Medical): No   Lack of Transportation (Non-Medical): No  Physical Activity: Sufficiently Active   Days of Exercise per Week: 3 days   Minutes of Exercise per Session: 60 min  Stress: No Stress Concern Present   Feeling of Stress : Not at all  Social Connections: Slightly Isolated   Frequency of Communication with Friends and Family: More than three times a week   Frequency of Social Gatherings with Friends and Family: Never   Attends Religious Services: More than 4 times per year   Active Member of [employer] or Organizations: No   Attends Archivist Meetings: Never   Marital Status: Married    Outpatient Encounter Medications as of 02/12/2019  Medication Sig   aspirin EC 81 MG tablet Take 81 mg by mouth daily.   atenolol-chlorthalidone (TENORETIC) 50-25 MG tablet TAKE 1 TABLET EVERY DAY (Patient taking differently: Take 1 tablet by mouth daily. )   benzonatate (TESSALON PERLES) 100 MG capsule Take 1 capsule (100 mg total) by mouth 3 (three) times daily as needed.   ibuprofen (ADVIL) 200 MG tablet Take 200-400 mg by mouth every 8 (eight) hours as needed (pain.).   brimonidine-timolol (COMBIGAN) 0.2-0.5 % ophthalmic solution Place 1 drop into the left eye 2 (two) times daily.   NON FORMULARY Place 1 drop into the left eye daily. Pred-Gati-Brom (Prednisolone Acetate/Gatifloxacin/Bromfenac)   No facility-administered encounter medications on file as of 02/12/2019.    Activities of Daily Living In your present state of health, do you have any difficulty performing the following activities: 02/12/2019  Hearing? N  Vision? N  Difficulty concentrating or making decisions? N  Walking or climbing stairs? N  Dressing or bathing? N  Doing errands, shopping? N  Preparing Food and eating ? N  Using the Toilet? N  In the past six months, have you accidently leaked urine? N  Do you have problems with loss  of bowel control? N  Managing your Medications? N  Managing your Finances? N  Housekeeping or managing your Housekeeping? N  Some recent data might be hidden    Patient Care Team: Chrismon, Vickki Muff, PA as PCP - General (Physician Assistant)   Assessment:   This is a routine wellness examination for [place].  Exercise Activities and Dietary recommendations Current Exercise Habits: Home exercise routine, Type of exercise: treadmill;stretching;strength training/weights;Other - see comments(eliptical), Time (Minutes): > 60, Frequency (Times/Week): 3, Weekly Exercise (Minutes/Week): 0, Intensity: Moderate, Exercise limited by: None identified  Goals      Increase water intake     Recommend increasing water intake to 4-6 glasses a day.        Fall Risk: Fall Risk  02/12/2019 08/08/2017 07/17/2016 07/12/2015 06/29/2014  Falls in  the past year? 0 No No No No  Number falls in past yr: 0 - - - -  Injury with Fall? 0 - - - -    FALL RISK PREVENTION PERTAINING TO THE HOME:  Any stairs in or around the home? Yes  If so, are there any without handrails? No   Home free of loose throw rugs in walkways, pet beds, electrical cords, etc? Yes  Adequate lighting in your home to reduce risk of falls? Yes   ASSISTIVE DEVICES UTILIZED TO PREVENT FALLS:  Life alert? No  Use of a cane, walker or w/c? No  Grab bars in the bathroom? No  Shower chair or bench in shower? No  Elevated toilet seat or a handicapped toilet? No   TIMED UP AND GO:  Was the test performed? No .    Depression Screen PHQ 2/9 Scores 02/12/2019 08/08/2017 08/08/2017 07/17/2016  PHQ - 2 Score 0 0 0 0  PHQ- 9 Score - 0 - 0    Cognitive Function: Declined today.      6CIT Screen 07/17/2016  What Year? 0 points  What month? 0 points  What time? 0 points  Count back from 20 0 points  Months in reverse 0 points  Repeat phrase 2 points  Total Score 2    Immunization History  Administered Date(s) Administered   Influenza  Split 10/09/2011   Influenza-Unspecified 09/01/2017   Pneumococcal Polysaccharide-23 10/09/2011   Tdap 10/19/2010    Qualifies for Shingles Vaccine? Yes . Due for Shingrix. Pt has been advised to call insurance company to determine out of pocket expense. Advised may also receive vaccine at local pharmacy or Health Dept. Verbalized acceptance and understanding.  Tdap: Up to date  Flu Vaccine: Due for Flu vaccine. Does the patient want to receive this vaccine today?  No . Advised may receive this vaccine at local pharmacy or Health Dept. Aware to provide a copy of the vaccination record if obtained from local pharmacy or Health Dept. Verbalized acceptance and understanding.  Pneumococcal Vaccine: Due for Pneumococcal vaccine. Does the patient want to receive this vaccine today?  No . Advised may receive this vaccine at local pharmacy or Health Dept. Aware to provide a copy of the vaccination record if obtained from local pharmacy or Health Dept. Verbalized acceptance and understanding.   Screening Tests Health Maintenance  Topic Date Due   INFLUENZA VACCINE  04/01/2019 (Originally 08/02/2018)   PNA vac Low Risk Adult (2 of 2 - PCV13) 02/12/2020 (Originally 10/08/2012)   TETANUS/TDAP  10/18/2020   COLONOSCOPY  09/19/2025   Hepatitis C Screening  Completed   Cancer Screenings:  Colorectal Screening: Completed 09/20/15. Repeat every 10 years.   Lung Cancer Screening: (Low Dose CT Chest recommended if Age 25-80 years, 30 pack-year currently smoking OR have quit w/in 15years.) does not qualify.   Additional Screening:  Hepatitis C Screening: Up to date Vision Screening: Recommended annual ophthalmology exams for early detection of glaucoma and other disorders of the eye.  Dental Screening: Recommended annual dental exams for proper oral hygiene  Community Resource Referral:  CRR required this visit?  No        Plan:  I have personally reviewed and addressed the Medicare Annual Wellness  questionnaire and have noted the following in the patient's chart:  A. Medical and social history B. Use of alcohol, tobacco or illicit drugs  C. Current medications and supplements D. Functional ability and status E.  Nutritional status F.  Physical  activity G. Advance directives H. List of other physicians I.  Hospitalizations, surgeries, and ER visits in previous 12 months J.  [place] such as hearing and vision if needed, cognitive and depression L. Referrals and appointments   In addition, I have reviewed and discussed with patient certain preventive protocols, quality metrics, and best practice recommendations. A written personalized care plan for preventive services as well as general preventive health recommendations were provided to patient.   [place] Score, [place]  07/28/6182 Nurse Health Advisor   Nurse Notes: Pt declined receiving the influenza or Prevnar 13 vaccine at next in office visit.   Reviewed note and plan of Nurse Health Advisor. Was available for consultation. Agree with documentation and recommendations.

## 2019-02-12 ENCOUNTER — Ambulatory Visit (INDEPENDENT_AMBULATORY_CARE_PROVIDER_SITE_OTHER): Payer: Medicare HMO

## 2019-02-12 ENCOUNTER — Other Ambulatory Visit: Payer: Self-pay

## 2019-02-12 DIAGNOSIS — Z0000 Encounter for general adult medical examination without abnormal findings: Secondary | ICD-10-CM | POA: Diagnosis not present

## 2019-02-12 NOTE — Patient Instructions (Signed)
Kenneth Collins , Thank you for taking time to come for your Medicare Wellness Visit. I appreciate your ongoing commitment to your health goals. Please review the following plan we discussed and let me know if I can assist you in the future.   Screening recommendations/referrals: Colonoscopy: Up to date, due 09/2025 Recommended yearly ophthalmology/optometry visit for glaucoma screening and checkup Recommended yearly dental visit for hygiene and checkup  Vaccinations: Influenza vaccine: Pt declines today.  Pneumococcal vaccine: Pt declines today.  Tdap vaccine: Up to date, due 10/2020 Shingles vaccine: Pt declines today.     Advanced directives: Advance directive discussed with you today. Even though you declined this today please call our office should you change your mind and we can give you the proper paperwork for you to fill out.  Conditions/risks identified: Recommend increasing water intake to 6-8 8 oz glasses a day.   Next appointment: None. Declined scheduling an follow up with PCP or an AWV for 2022 at this time.   Preventive Care 22 Years and Older, Male Preventive care refers to lifestyle choices and visits with your health care provider that can promote health and wellness. What does preventive care include?  A yearly physical exam. This is also called an annual well check.  Dental exams once or twice a year.  Routine eye exams. Ask your health care provider how often you should have your eyes checked.  Personal lifestyle choices, including:  Daily care of your teeth and gums.  Regular physical activity.  Eating a healthy diet.  Avoiding tobacco and drug use.  Limiting alcohol use.  Practicing safe sex.  Taking low doses of aspirin every day.  Taking vitamin and mineral supplements as recommended by your health care provider. What happens during an annual well check? The services and screenings done by your health care provider during your annual well check will  depend on your age, overall health, lifestyle risk factors, and family history of disease. Counseling  Your health care provider may ask you questions about your:  Alcohol use.  Tobacco use.  Drug use.  Emotional well-being.  Home and relationship well-being.  Sexual activity.  Eating habits.  History of falls.  Memory and ability to understand (cognition).  Work and work Statistician. Screening  You may have the following tests or measurements:  Height, weight, and BMI.  Blood pressure.  Lipid and cholesterol levels. These may be checked every 5 years, or more frequently if you are over 68 years old.  Skin check.  Lung cancer screening. You may have this screening every year starting at age 38 if you have a 30-pack-year history of smoking and currently smoke or have quit within the past 15 years.  Fecal occult blood test (FOBT) of the stool. You may have this test every year starting at age 58.  Flexible sigmoidoscopy or colonoscopy. You may have a sigmoidoscopy every 5 years or a colonoscopy every 10 years starting at age 44.  Prostate cancer screening. Recommendations will vary depending on your family history and other risks.  Hepatitis C blood test.  Hepatitis B blood test.  Sexually transmitted disease (STD) testing.  Diabetes screening. This is done by checking your blood sugar (glucose) after you have not eaten for a while (fasting). You may have this done every 1-3 years.  Abdominal aortic aneurysm (AAA) screening. You may need this if you are a current or former smoker.  Osteoporosis. You may be screened starting at age 89 if you are at high  risk. Talk with your health care provider about your test results, treatment options, and if necessary, the need for more tests. Vaccines  Your health care provider may recommend certain vaccines, such as:  Influenza vaccine. This is recommended every year.  Tetanus, diphtheria, and acellular pertussis (Tdap,  Td) vaccine. You may need a Td booster every 10 years.  Zoster vaccine. You may need this after age 32.  Pneumococcal 13-valent conjugate (PCV13) vaccine. One dose is recommended after age 12.  Pneumococcal polysaccharide (PPSV23) vaccine. One dose is recommended after age 44. Talk to your health care provider about which screenings and vaccines you need and how often you need them. This information is not intended to replace advice given to you by your health care provider. Make sure you discuss any questions you have with your health care provider. Document Released: 01/14/2015 Document Revised: 09/07/2015 Document Reviewed: 10/19/2014 Elsevier Interactive Patient Education  2017 [place] Prevention in the Home Falls can cause injuries. They can happen to people of all ages. There are many things you can do to make your home safe and to help prevent falls. What can I do on the outside of my home?  Regularly fix the edges of walkways and driveways and fix any cracks.  Remove anything that might make you trip as you walk through a door, such as a raised step or threshold.  Trim any bushes or trees on the path to your home.  Use bright outdoor lighting.  Clear any walking paths of anything that might make someone trip, such as rocks or tools.  Regularly check to see if handrails are loose or broken. Make sure that both sides of any steps have handrails.  Any raised decks and porches should have guardrails on the edges.  Have any leaves, snow, or ice cleared regularly.  Use sand or salt on walking paths during winter.  Clean up any spills in your garage right away. This includes oil or grease spills. What can I do in the bathroom?  Use night lights.  Install grab bars by the toilet and in the tub and shower. Do not use towel bars as grab bars.  Use non-skid mats or decals in the tub or shower.  If you need to sit down in the shower, use a plastic, non-slip  stool.  Keep the floor dry. Clean up any water that spills on the floor as soon as it happens.  Remove soap buildup in the tub or shower regularly.  Attach bath mats securely with double-sided non-slip rug tape.  Do not have throw rugs and other things on the floor that can make you trip. What can I do in the bedroom?  Use night lights.  Make sure that you have a light by your bed that is easy to reach.  Do not use any sheets or blankets that are too big for your bed. They should not hang down onto the floor.  Have a firm chair that has side arms. You can use this for support while you get dressed.  Do not have throw rugs and other things on the floor that can make you trip. What can I do in the kitchen?  Clean up any spills right away.  Avoid walking on wet floors.  Keep items that you use a lot in easy-to-reach places.  If you need to reach something above you, use a strong step stool that has a grab bar.  Keep electrical cords out of the way.  Do not use floor polish or wax that makes floors slippery. If you must use wax, use non-skid floor wax.  Do not have throw rugs and other things on the floor that can make you trip. What can I do with my stairs?  Do not leave any items on the stairs.  Make sure that there are handrails on both sides of the stairs and use them. Fix handrails that are broken or loose. Make sure that handrails are as long as the stairways.  Check any carpeting to make sure that it is firmly attached to the stairs. Fix any carpet that is loose or worn.  Avoid having throw rugs at the top or bottom of the stairs. If you do have throw rugs, attach them to the floor with carpet tape.  Make sure that you have a light switch at the top of the stairs and the bottom of the stairs. If you do not have them, ask someone to add them for you. What else can I do to help prevent falls?  Wear shoes that:  Do not have high heels.  Have rubber bottoms.  Are  comfortable and fit you well.  Are closed at the toe. Do not wear sandals.  If you use a stepladder:  Make sure that it is fully opened. Do not climb a closed stepladder.  Make sure that both sides of the stepladder are locked into place.  Ask someone to hold it for you, if possible.  Clearly mark and make sure that you can see:  Any grab bars or handrails.  First and last steps.  Where the edge of each step is.  Use tools that help you move around (mobility aids) if they are needed. These include:  Canes.  Walkers.  Scooters.  Crutches.  Turn on the lights when you go into a dark area. Replace any light bulbs as soon as they burn out.  Set up your furniture so you have a clear path. Avoid moving your furniture around.  If any of your floors are uneven, fix them.  If there are any pets around you, be aware of where they are.  Review your medicines with your doctor. Some medicines can make you feel dizzy. This can increase your chance of falling. Ask your doctor what other things that you can do to help prevent falls. This information is not intended to replace advice given to you by your health care provider. Make sure you discuss any questions you have with your health care provider. Document Released: 10/14/2008 Document Revised: 05/26/2015 Document Reviewed: 01/22/2014 Elsevier Interactive Patient Education  2017 [employer].

## 2019-03-24 DIAGNOSIS — H43811 Vitreous degeneration, right eye: Secondary | ICD-10-CM | POA: Diagnosis not present

## 2019-05-08 NOTE — Progress Notes (Signed)
Complete physical exam   Patient: Kenneth Collins   DOB: [birth year]   74 y.o. Male  MRN: [id number] Visit Date: 05/12/2019  Today's healthcare provider: Vernie Murders, PA   Chief Complaint  Patient presents with  . Annual Exam   Subjective    Kenneth Collins is a 74 y.o. male who presents today for a complete physical exam.  He was seen by the nurse health adviser on 02/12/19.  He is up to date of his screening, focus metrics and health maintenance with the exception of Covid and Prevnar vaccines, which he has declined.  He reports consuming a general diet. He is exercising at the [facility] 3 days weekly, walking the treadmill, ellyptical and weights. He generally feels well. He reports sleeping well. He does not have additional problems to discuss today.    Immunization History  Administered Date(s) Administered  . Influenza Split 10/09/2011  . Influenza-Unspecified 09/01/2017  . Pneumococcal Polysaccharide-23 10/09/2011  . Tdap 10/19/2010    Past Medical History:  Diagnosis Date  . Arthritis   . Cancer ([place])    SKIN  . COVID-19   . Diverticulitis   . Edema    FEET/ANKLES  . Hypertension    Past Surgical History:  Procedure Laterality Date  . BACK SURGERY    . CATARACT EXTRACTION W/PHACO Left 07/31/2018   Procedure: CATARACT EXTRACTION PHACO AND INTRAOCULAR LENS PLACEMENT (IOC);  Surgeon: Marchia Meiers, MD;  Location: [facility] ORS;  Service: Ophthalmology;  Laterality: Left;  [place] 00:37.4 CDE 5.06 Fluid Pack Lot # [id number]  . CATARACT EXTRACTION W/PHACO Right 08/28/2018   Procedure: CATARACT EXTRACTION PHACO AND INTRAOCULAR LENS PLACEMENT (IOC);  Surgeon: Marchia Meiers, MD;  Location: [facility] ORS;  Service: Ophthalmology;  Laterality: Right;  [place] 00:49.3 CDE 6.95 Fluid Pack Lot # [id number]  . COLONOSCOPY WITH PROPOFOL N/A 09/20/2015   Procedure: COLONOSCOPY WITH PROPOFOL;  Surgeon: [name], MD;  Location: [facility] ENDOSCOPY;  Service: Endoscopy;  Laterality: N/A;  . MICRODISCECTOMY  LUMBAR  03/17/2013  . VASECTOMY     Social History   Socioeconomic History  . Marital status: Married    Spouse name: Not on file  . Number of children: 2  . Years of education: Not on file  . Highest education level: 12th grade  Occupational History  . Occupation: retired  Tobacco Use  . Smoking status: Former Smoker    Types: Cigarettes  . Smokeless tobacco: Never Used  . Tobacco comment: > 50 years ago  Substance and Sexual Activity  . Alcohol use: Not Currently    Alcohol/week: 0.0 standard drinks    Comment: 0-2 glasses of wine a month.   . Drug use: No  . Sexual activity: Not on file  Other Topics Concern  . Not on file  Social History Narrative  . Not on file   Social Determinants of Health   Financial Resource Strain: Low Risk   . Difficulty of Paying Living Expenses: Not hard at all  Food Insecurity: No Food Insecurity  . Worried About Charity fundraiser in the Last Year: Never true  . Ran Out of Food in the Last Year: Never true  Transportation Needs: No Transportation Needs  . Lack of Transportation (Medical): No  . Lack of Transportation (Non-Medical): No  Physical Activity: Sufficiently Active  . Days of Exercise per Week: 3 days  . Minutes of Exercise per Session: 60 min  Stress: No Stress Concern Present  . Feeling of Stress : Not at  all  Social Connections: Slightly Isolated  . Frequency of Communication with Friends and Family: More than three times a week  . Frequency of Social Gatherings with Friends and Family: Never  . Attends Religious Services: More than 4 times per year  . Active Member of Clubs or Organizations: No  . Attends Archivist Meetings: Never  . Marital Status: Married  Human resources officer Violence: Not At Risk  . Fear of Current or Ex-Partner: No  . Emotionally Abused: No  . Physically Abused: No  . Sexually Abused: No   Family Status  Relation Name Status  . Father  Deceased  . Mother  Deceased  . Sister  Alive    . Mat Uncle  (Not Specified)   Family History  Problem Relation Age of Onset  . Heart disease Father   . Pneumonia Mother   . Cancer Maternal Uncle    No Known Allergies  Patient Care Team: Lessa Huge, Vickki Muff, PA as PCP - General (Physician Assistant)   Medications: Outpatient Medications Prior to Visit  Medication Sig  . aspirin EC 81 MG tablet Take 81 mg by mouth daily.  Marland Kitchen atenolol-chlorthalidone (TENORETIC) 50-25 MG tablet TAKE 1 TABLET EVERY DAY (Patient taking differently: Take 1 tablet by mouth daily. )  . ibuprofen (ADVIL) 200 MG tablet Take 200-400 mg by mouth every 8 (eight) hours as needed (pain.).  . [DISCONTINUED] benzonatate (TESSALON PERLES) 100 MG capsule Take 1 capsule (100 mg total) by mouth 3 (three) times daily as needed.  . [DISCONTINUED] brimonidine-timolol (COMBIGAN) 0.2-0.5 % ophthalmic solution Place 1 drop into the left eye 2 (two) times daily.  . [DISCONTINUED] NON FORMULARY Place 1 drop into the left eye daily. Pred-Gati-Brom (Prednisolone Acetate/Gatifloxacin/Bromfenac)   No facility-administered medications prior to visit.    Review of Systems  Constitutional: Negative.   HENT: Positive for hearing loss and tinnitus.   Eyes: Negative.   Respiratory: Negative.   Cardiovascular: Negative.   Gastrointestinal: Negative.   Endocrine: Negative.   Genitourinary: Negative.   Musculoskeletal: Negative.   Skin: Negative.   Allergic/Immunologic: Negative.   Neurological: Negative.   Hematological: Negative.   Psychiatric/Behavioral: Negative.      Objective    BP 132/70 (BP Location: Right Arm, Patient Position: Sitting, Cuff Size: Normal)   Pulse (!) 59   Temp (!) 96.8 F (36 C) (Skin)   Ht 5\' 7"  (1.702 m)   Wt 182 lb (82.6 kg)   SpO2 98%   BMI 28.51 kg/m    Physical Exam Constitutional:      Appearance: Normal appearance. He is normal weight.  HENT:     Head: Normocephalic and atraumatic.     Right Ear: Tympanic membrane, ear canal and  external ear normal.     Left Ear: Tympanic membrane, ear canal and external ear normal.     Nose: Nose normal.     Mouth/Throat:     Mouth: Mucous membranes are moist.     Pharynx: Oropharynx is clear.  Eyes:     Extraocular Movements: Extraocular movements intact.     Conjunctiva/sclera: Conjunctivae normal.     Pupils: Pupils are equal, round, and reactive to light.  Cardiovascular:     Rate and Rhythm: Normal rate and regular rhythm.     Pulses: Normal pulses.     Heart sounds: Normal heart sounds.  Pulmonary:     Effort: Pulmonary effort is normal.     Breath sounds: Normal breath sounds.  Abdominal:  General: Abdomen is flat. Bowel sounds are normal.     Palpations: Abdomen is soft.  Genitourinary:    Penis: Normal.      Testes: Normal.     Prostate: Normal.     Rectum: Normal. Guaiac result negative.  Musculoskeletal:        General: Normal range of motion.     Cervical back: Normal range of motion and neck supple.  Skin:    General: Skin is warm and dry.  Neurological:     General: No focal deficit present.     Mental Status: He is alert and oriented to person, place, and time. Mental status is at baseline.  Psychiatric:        Mood and Affect: Mood normal.        Behavior: Behavior normal.        Thought Content: Thought content normal.        Judgment: Judgment normal.     No results found for any visits on 05/12/19.  Assessment & Plan    Routine Health Maintenance and Physical Exam  Exercise Activities and Dietary recommendations Goals    . Increase water intake     Recommend increasing water intake to 4-6 glasses a day.       Immunization History  Administered Date(s) Administered  . Influenza Split 10/09/2011  . Influenza-Unspecified 09/01/2017  . Pneumococcal Polysaccharide-23 10/09/2011  . Tdap 10/19/2010    Health Maintenance  Topic Date Due  . COVID-19 Vaccine (1) Never done  . PNA vac Low Risk Adult (2 of 2 - PCV13) 02/12/2020  (Originally 10/08/2012)  . INFLUENZA VACCINE  08/02/2019  . TETANUS/TDAP  10/18/2020  . COLONOSCOPY  09/19/2025  . Hepatitis C Screening  Completed    Discussed health benefits of physical activity, and encouraged him to engage in regular exercise appropriate for his age and condition.  1. Stage 3a chronic kidney disease Feeling well. Denies any urinary tract symptoms. DRE normal without prostate nodule or enlargement. Medicare wellness screening on 02-12-19 was unremarkable. Labs on 09-18-18 was normal except creatinine 1.48 and GFR 46. Recheck CBC and CMP. Declines COVID vaccination and Prevnar vaccination. Had a history of COVID infection in November 2020. All symptoms have resolved. Follow up pending reports. - CBC with Differential/Platelet - Comprehensive metabolic panel  2. Essential (primary) hypertension Well controlled BP and tolerating Tenoretic daily without side effects. Will recheck CBC and CMP.  - atenolol-chlorthalidone (TENORETIC) 50-25 MG tablet; Take 1 tablet by mouth daily.  Dispense: 90 tablet; Refill: 3 - CBC with Differential/Platelet - Comprehensive metabolic panel  3. History of diverticulosis Normal regular BM habits. No flare of diverticulosis. Last colonoscopy in 2017 by Dr. Allen Norris (GI) was essentially normal.     I, Vernie Murders, PA, have reviewed all documentation for this visit. The documentation on 05/12/19 for the exam, diagnosis, procedures, and orders are all accurate and complete.    Vernie Murders, [place] [phone] (phone) [phone] (fax)  [place]

## 2019-05-12 ENCOUNTER — Other Ambulatory Visit: Payer: Self-pay

## 2019-05-12 ENCOUNTER — Ambulatory Visit (INDEPENDENT_AMBULATORY_CARE_PROVIDER_SITE_OTHER): Payer: Medicare HMO | Admitting: Family Medicine

## 2019-05-12 ENCOUNTER — Encounter: Payer: Self-pay | Admitting: Family Medicine

## 2019-05-12 VITALS — BP 132/70 | HR 59 | Temp 96.8°F | Ht 67.0 in | Wt 182.0 lb

## 2019-05-12 DIAGNOSIS — N1831 Chronic kidney disease, stage 3a: Secondary | ICD-10-CM

## 2019-05-12 DIAGNOSIS — Z8719 Personal history of other diseases of the digestive system: Secondary | ICD-10-CM | POA: Diagnosis not present

## 2019-05-12 DIAGNOSIS — I10 Essential (primary) hypertension: Secondary | ICD-10-CM | POA: Diagnosis not present

## 2019-05-12 MED ORDER — ATENOLOL-CHLORTHALIDONE 50-25 MG PO TABS: 1.0000 | ORAL_TABLET | Freq: Every day | ORAL | 3 refills | Status: DC

## 2019-05-12 NOTE — Patient Instructions (Signed)
DASH Eating Plan DASH stands for "Dietary Approaches to Stop Hypertension." The DASH eating plan is a healthy eating plan that has been shown to reduce high blood pressure (hypertension). It may also reduce your risk for type 2 diabetes, heart disease, and stroke. The DASH eating plan may also help with weight loss. What are tips for following this plan?  General guidelines  Avoid eating more than 2,300 mg (milligrams) of salt (sodium) a day. If you have hypertension, you may need to reduce your sodium intake to 1,500 mg a day.  Limit alcohol intake to no more than 1 drink a day for nonpregnant women and 2 drinks a day for men. One drink equals 12 oz of beer, 5 oz of wine, or 1 oz of hard liquor.  Work with your health care provider to maintain a healthy body weight or to lose weight. Ask what an ideal weight is for you.  Get at least 30 minutes of exercise that causes your heart to beat faster (aerobic exercise) most days of the week. Activities may include walking, swimming, or biking.  Work with your health care provider or diet and nutrition specialist (dietitian) to adjust your eating plan to your individual calorie needs. Reading food labels   Check food labels for the amount of sodium per serving. Choose foods with less than 5 percent of the Daily Value of sodium. Generally, foods with less than 300 mg of sodium per serving fit into this eating plan.  To find whole grains, look for the word "whole" as the first word in the ingredient list. Shopping  Buy products labeled as "low-sodium" or "no salt added."  Buy fresh foods. Avoid canned foods and premade or frozen meals. Cooking  Avoid adding salt when cooking. Use salt-free seasonings or herbs instead of table salt or sea salt. Check with your health care provider or pharmacist before using salt substitutes.  Do not fry foods. Cook foods using healthy methods such as baking, boiling, grilling, and broiling instead.  Cook with  heart-healthy oils, such as olive, canola, soybean, or sunflower oil. Meal planning  Eat a balanced diet that includes: ? 5 or more servings of fruits and vegetables each day. At each meal, try to fill half of your plate with fruits and vegetables. ? Up to 6-8 servings of whole grains each day. ? Less than 6 oz of lean meat, poultry, or fish each day. A 3-oz serving of meat is about the same size as a deck of cards. One egg equals 1 oz. ? 2 servings of low-fat dairy each day. ? A serving of nuts, seeds, or beans 5 times each week. ? Heart-healthy fats. Healthy fats called Omega-3 fatty acids are found in foods such as flaxseeds and coldwater fish, like sardines, salmon, and mackerel.  Limit how much you eat of the following: ? Canned or prepackaged foods. ? Food that is high in trans fat, such as fried foods. ? Food that is high in saturated fat, such as fatty meat. ? Sweets, desserts, sugary drinks, and other foods with added sugar. ? Full-fat dairy products.  Do not salt foods before eating.  Try to eat at least 2 vegetarian meals each week.  Eat more home-cooked food and less restaurant, buffet, and fast food.  When eating at a restaurant, ask that your food be prepared with less salt or no salt, if possible. What foods are recommended? The items listed may not be a complete list. Talk with your dietitian about   what dietary choices are best for you. Grains Whole-grain or whole-wheat bread. Whole-grain or whole-wheat pasta. Brown rice. Oatmeal. Quinoa. Bulgur. Whole-grain and low-sodium cereals. Pita bread. Low-fat, low-sodium crackers. Whole-wheat flour tortillas. Vegetables Fresh or frozen vegetables (raw, steamed, roasted, or grilled). Low-sodium or reduced-sodium tomato and vegetable juice. Low-sodium or reduced-sodium tomato sauce and tomato paste. Low-sodium or reduced-sodium canned vegetables. Fruits All fresh, dried, or frozen fruit. Canned fruit in natural juice (without  added sugar). Meat and other protein foods Skinless chicken or turkey. Ground chicken or turkey. Pork with fat trimmed off. Fish and seafood. Egg whites. Dried beans, peas, or lentils. Unsalted nuts, nut butters, and seeds. Unsalted canned beans. Lean cuts of beef with fat trimmed off. Low-sodium, lean deli meat. Dairy Low-fat (1%) or fat-free (skim) milk. Fat-free, low-fat, or reduced-fat cheeses. Nonfat, low-sodium ricotta or cottage cheese. Low-fat or nonfat yogurt. Low-fat, low-sodium cheese. Fats and oils Soft margarine without trans fats. Vegetable oil. Low-fat, reduced-fat, or light mayonnaise and salad dressings (reduced-sodium). Canola, safflower, olive, soybean, and sunflower oils. Avocado. Seasoning and other foods Herbs. Spices. Seasoning mixes without salt. Unsalted popcorn and pretzels. Fat-free sweets. What foods are not recommended? The items listed may not be a complete list. Talk with your dietitian about what dietary choices are best for you. Grains Baked goods made with fat, such as croissants, muffins, or some breads. Dry pasta or rice meal packs. Vegetables Creamed or fried vegetables. Vegetables in a cheese sauce. Regular canned vegetables (not low-sodium or reduced-sodium). Regular canned tomato sauce and paste (not low-sodium or reduced-sodium). Regular tomato and vegetable juice (not low-sodium or reduced-sodium). Pickles. Olives. Fruits Canned fruit in a light or heavy syrup. Fried fruit. Fruit in cream or butter sauce. Meat and other protein foods Fatty cuts of meat. Ribs. Fried meat. Bacon. Sausage. Bologna and other processed lunch meats. Salami. Fatback. Hotdogs. Bratwurst. Salted nuts and seeds. Canned beans with added salt. Canned or smoked fish. Whole eggs or egg yolks. Chicken or turkey with skin. Dairy Whole or 2% milk, cream, and half-and-half. Whole or full-fat cream cheese. Whole-fat or sweetened yogurt. Full-fat cheese. Nondairy creamers. Whipped toppings.  Processed cheese and cheese spreads. Fats and oils Butter. Stick margarine. Lard. Shortening. Ghee. Bacon fat. Tropical oils, such as coconut, palm kernel, or palm oil. Seasoning and other foods Salted popcorn and pretzels. Onion salt, garlic salt, seasoned salt, table salt, and sea salt. Worcestershire sauce. Tartar sauce. Barbecue sauce. Teriyaki sauce. Soy sauce, including reduced-sodium. Steak sauce. Canned and packaged gravies. Fish sauce. Oyster sauce. Cocktail sauce. Horseradish that you find on the shelf. Ketchup. Mustard. Meat flavorings and tenderizers. Bouillon cubes. Hot sauce and Tabasco sauce. Premade or packaged marinades. Premade or packaged taco seasonings. Relishes. Regular salad dressings. Where to find more information:  National Heart, Lung, and Blood Institute: www.nhlbi.nih.gov  American Heart Association: www.heart.org Summary  The DASH eating plan is a healthy eating plan that has been shown to reduce high blood pressure (hypertension). It may also reduce your risk for type 2 diabetes, heart disease, and stroke.  With the DASH eating plan, you should limit salt (sodium) intake to 2,300 mg a day. If you have hypertension, you may need to reduce your sodium intake to 1,500 mg a day.  When on the DASH eating plan, aim to eat more fresh fruits and vegetables, whole grains, lean proteins, low-fat dairy, and heart-healthy fats.  Work with your health care provider or diet and nutrition specialist (dietitian) to adjust your eating plan to your   individual calorie needs. This information is not intended to replace advice given to you by your health care provider. Make sure you discuss any questions you have with your health care provider. Document Revised: 11/30/2016 Document Reviewed: 12/12/2015 Elsevier Patient Education  2020 Elsevier Inc.  

## 2019-05-13 LAB — CBC WITH DIFFERENTIAL/PLATELET
Basophils Absolute: 0 10*3/uL (ref 0.0–0.2)
Basos: 0 %
EOS (ABSOLUTE): 0.2 10*3/uL (ref 0.0–0.4)
Eos: 2 %
Hematocrit: 44.2 % (ref 37.5–51.0)
Hemoglobin: 15.2 g/dL (ref 13.0–17.7)
Immature Grans (Abs): 0 10*3/uL (ref 0.0–0.1)
Immature Granulocytes: 0 %
Lymphocytes Absolute: 1.6 10*3/uL (ref 0.7–3.1)
Lymphs: 21 %
MCH: 31 pg (ref 26.6–33.0)
MCHC: 34.4 g/dL (ref 31.5–35.7)
MCV: 90 fL (ref 79–97)
Monocytes Absolute: 0.6 10*3/uL (ref 0.1–0.9)
Monocytes: 8 %
Neutrophils Absolute: 5.1 10*3/uL (ref 1.4–7.0)
Neutrophils: 69 %
Platelets: 216 10*3/uL (ref 150–450)
RBC: 4.91 x10E6/uL (ref 4.14–5.80)
RDW: 12.9 % (ref 11.6–15.4)
WBC: 7.4 10*3/uL (ref 3.4–10.8)

## 2019-05-13 LAB — COMPREHENSIVE METABOLIC PANEL
ALT: 17 IU/L (ref 0–44)
AST: 16 IU/L (ref 0–40)
Albumin/Globulin Ratio: 1.5 (ref 1.2–2.2)
Albumin: 4 g/dL (ref 3.7–4.7)
Alkaline Phosphatase: 62 IU/L (ref 39–117)
BUN/Creatinine Ratio: 17 (ref 10–24)
BUN: 34 mg/dL — ABNORMAL HIGH (ref 8–27)
Bilirubin Total: 0.4 mg/dL (ref 0.0–1.2)
CO2: 23 mmol/L (ref 20–29)
Calcium: 8.9 mg/dL (ref 8.6–10.2)
Chloride: 107 mmol/L — ABNORMAL HIGH (ref 96–106)
Creatinine, Ser: 2.05 mg/dL — ABNORMAL HIGH (ref 0.76–1.27)
GFR calc Af Amer: 36 mL/min/{1.73_m2} — ABNORMAL LOW (ref >59)
GFR calc non Af Amer: 31 mL/min/{1.73_m2} — ABNORMAL LOW (ref >59)
Globulin, Total: 2.6 g/dL (ref 1.5–4.5)
Glucose: 85 mg/dL (ref 65–99)
Potassium: 4.1 mmol/L (ref 3.5–5.2)
Sodium: 140 mmol/L (ref 134–144)
Total Protein: 6.6 g/dL (ref 6.0–8.5)

## 2019-05-14 ENCOUNTER — Other Ambulatory Visit: Payer: Self-pay

## 2019-05-14 DIAGNOSIS — N1832 Chronic kidney disease, stage 3b: Secondary | ICD-10-CM

## 2019-07-17 ENCOUNTER — Other Ambulatory Visit: Payer: Self-pay | Admitting: Nephrology

## 2019-07-17 DIAGNOSIS — R809 Proteinuria, unspecified: Secondary | ICD-10-CM | POA: Diagnosis not present

## 2019-07-17 DIAGNOSIS — I129 Hypertensive chronic kidney disease with stage 1 through stage 4 chronic kidney disease, or unspecified chronic kidney disease: Secondary | ICD-10-CM

## 2019-07-17 DIAGNOSIS — R609 Edema, unspecified: Secondary | ICD-10-CM | POA: Diagnosis not present

## 2019-07-17 DIAGNOSIS — N1831 Chronic kidney disease, stage 3a: Secondary | ICD-10-CM

## 2019-07-17 DIAGNOSIS — N178 Other acute kidney failure: Secondary | ICD-10-CM | POA: Diagnosis not present

## 2019-07-28 ENCOUNTER — Other Ambulatory Visit: Payer: Self-pay

## 2019-07-28 ENCOUNTER — Ambulatory Visit
Admission: RE | Admit: 2019-07-28 | Discharge: 2019-07-28 | Disposition: A | Discharge status: Home / Self Care | Payer: Medicare HMO | Source: Ambulatory Visit | Attending: Nephrology | Admitting: Nephrology

## 2019-07-28 DIAGNOSIS — I129 Hypertensive chronic kidney disease with stage 1 through stage 4 chronic kidney disease, or unspecified chronic kidney disease: Secondary | ICD-10-CM | POA: Diagnosis not present

## 2019-07-28 DIAGNOSIS — N186 End stage renal disease: Secondary | ICD-10-CM | POA: Diagnosis not present

## 2019-07-28 DIAGNOSIS — R809 Proteinuria, unspecified: Secondary | ICD-10-CM | POA: Insufficient documentation

## 2019-07-28 DIAGNOSIS — N281 Cyst of kidney, acquired: Secondary | ICD-10-CM | POA: Diagnosis not present

## 2019-07-28 DIAGNOSIS — N1831 Chronic kidney disease, stage 3a: Secondary | ICD-10-CM | POA: Insufficient documentation

## 2019-08-20 DIAGNOSIS — I129 Hypertensive chronic kidney disease with stage 1 through stage 4 chronic kidney disease, or unspecified chronic kidney disease: Secondary | ICD-10-CM | POA: Diagnosis not present

## 2019-08-20 DIAGNOSIS — R809 Proteinuria, unspecified: Secondary | ICD-10-CM | POA: Diagnosis not present

## 2019-08-20 DIAGNOSIS — N178 Other acute kidney failure: Secondary | ICD-10-CM | POA: Diagnosis not present

## 2019-08-20 DIAGNOSIS — N1832 Chronic kidney disease, stage 3b: Secondary | ICD-10-CM | POA: Diagnosis not present

## 2019-09-08 DIAGNOSIS — N1832 Chronic kidney disease, stage 3b: Secondary | ICD-10-CM | POA: Diagnosis not present

## 2019-09-08 DIAGNOSIS — R809 Proteinuria, unspecified: Secondary | ICD-10-CM | POA: Diagnosis not present

## 2019-09-08 DIAGNOSIS — N178 Other acute kidney failure: Secondary | ICD-10-CM | POA: Diagnosis not present

## 2019-09-08 DIAGNOSIS — I129 Hypertensive chronic kidney disease with stage 1 through stage 4 chronic kidney disease, or unspecified chronic kidney disease: Secondary | ICD-10-CM | POA: Diagnosis not present

## 2019-10-01 DIAGNOSIS — R809 Proteinuria, unspecified: Secondary | ICD-10-CM | POA: Diagnosis not present

## 2019-10-01 DIAGNOSIS — R601 Generalized edema: Secondary | ICD-10-CM | POA: Diagnosis not present

## 2019-10-01 DIAGNOSIS — N1832 Chronic kidney disease, stage 3b: Secondary | ICD-10-CM | POA: Diagnosis not present

## 2019-10-01 DIAGNOSIS — I129 Hypertensive chronic kidney disease with stage 1 through stage 4 chronic kidney disease, or unspecified chronic kidney disease: Secondary | ICD-10-CM | POA: Diagnosis not present

## 2019-10-06 DIAGNOSIS — L570 Actinic keratosis: Secondary | ICD-10-CM | POA: Diagnosis not present

## 2019-10-06 DIAGNOSIS — D2261 Melanocytic nevi of right upper limb, including shoulder: Secondary | ICD-10-CM | POA: Diagnosis not present

## 2019-10-06 DIAGNOSIS — D2271 Melanocytic nevi of right lower limb, including hip: Secondary | ICD-10-CM | POA: Diagnosis not present

## 2019-10-06 DIAGNOSIS — X32XXXA Exposure to sunlight, initial encounter: Secondary | ICD-10-CM | POA: Diagnosis not present

## 2019-10-06 DIAGNOSIS — D2272 Melanocytic nevi of left lower limb, including hip: Secondary | ICD-10-CM | POA: Diagnosis not present

## 2019-10-06 DIAGNOSIS — D225 Melanocytic nevi of trunk: Secondary | ICD-10-CM | POA: Diagnosis not present

## 2019-10-06 DIAGNOSIS — D2262 Melanocytic nevi of left upper limb, including shoulder: Secondary | ICD-10-CM | POA: Diagnosis not present

## 2019-10-06 DIAGNOSIS — B372 Candidiasis of skin and nail: Secondary | ICD-10-CM | POA: Diagnosis not present

## 2019-10-06 DIAGNOSIS — L821 Other seborrheic keratosis: Secondary | ICD-10-CM | POA: Diagnosis not present

## 2019-12-24 DIAGNOSIS — I129 Hypertensive chronic kidney disease with stage 1 through stage 4 chronic kidney disease, or unspecified chronic kidney disease: Secondary | ICD-10-CM | POA: Diagnosis not present

## 2019-12-24 DIAGNOSIS — R809 Proteinuria, unspecified: Secondary | ICD-10-CM | POA: Diagnosis not present

## 2019-12-24 DIAGNOSIS — N1832 Chronic kidney disease, stage 3b: Secondary | ICD-10-CM | POA: Diagnosis not present

## 2019-12-24 DIAGNOSIS — R601 Generalized edema: Secondary | ICD-10-CM | POA: Diagnosis not present

## 2019-12-31 DIAGNOSIS — R609 Edema, unspecified: Secondary | ICD-10-CM | POA: Diagnosis not present

## 2019-12-31 DIAGNOSIS — I129 Hypertensive chronic kidney disease with stage 1 through stage 4 chronic kidney disease, or unspecified chronic kidney disease: Secondary | ICD-10-CM | POA: Diagnosis not present

## 2019-12-31 DIAGNOSIS — N1832 Chronic kidney disease, stage 3b: Secondary | ICD-10-CM | POA: Diagnosis not present

## 2019-12-31 DIAGNOSIS — R809 Proteinuria, unspecified: Secondary | ICD-10-CM | POA: Diagnosis not present

## 2020-02-04 ENCOUNTER — Telehealth: Payer: Self-pay | Admitting: Family Medicine

## 2020-02-04 NOTE — Telephone Encounter (Signed)
Copied from [place] [id number]. Topic: Medicare AWV >> Feb 04, 2020  3:08 PM Cher Nakai R wrote: Reason for CRM:  Left message for patient to call back and schedule Medicare Annual Wellness Visit (AWV) in office.   If not able to come in office, please offer to do virtually or by telephone.   Last AWV 02/12/2019  Please schedule at anytime with [facility] Health Advisor.  If any questions, please contact me at [phone]

## 2020-02-10 NOTE — Progress Notes (Addendum)
Subjective:   Kenneth Collins is a 75 y.o. male who presents for Medicare Annual/Subsequent preventive examination.  Review of Systems    N/A  Cardiac Risk Factors include: advanced age (>5men, >21 women);male gender;hypertension     Objective:    Today's Vitals   02/11/20 0943 02/11/20 1007  BP: (!) 166/62 (!) 156/58  Pulse: 75   Temp: 98.7 F (37.1 C)   TempSrc: Oral   SpO2: 95%   Weight: 192 lb 6.4 oz (87.3 kg)   Height: 5\' 7"  (1.702 m)   PainSc: 0-No pain    Body mass index is 30.13 kg/m.  Advanced Directives 02/11/2020 02/12/2019 08/08/2017 07/17/2016 09/20/2015 07/12/2015 06/29/2014  Does Patient Have a Medical Advance Directive? No No Yes No No No No  Type of Advance Directive - Public librarian;Living will - - - -  Copy of [place] in Chart? - - No - copy requested - - - -  Would patient like information on creating a medical advance directive? No - Patient declined No - Patient declined - No - Patient declined - - -    Current Medications (verified) Outpatient Encounter Medications as of 02/11/2020  Medication Sig   acetaminophen (TYLENOL) 500 MG tablet Take 500 mg by mouth daily at 6 (six) AM. As needed for mild pain.   amLODipine (NORVASC) 2.5 MG tablet Take 2.5 mg by mouth daily.   Ascorbic Acid (VITAMIN C) 1000 MG tablet Take 1,000 mg by mouth daily.   aspirin EC 81 MG tablet Take 81 mg by mouth daily.   Cholecalciferol (DIALYVITE VITAMIN D 5000) 125 MCG (5000 UT) capsule Take 5,000 Units by mouth daily.   irbesartan (AVAPRO) 300 MG tablet Take 300 mg by mouth daily.   Zinc Sulfate (ZINC 15 PO) Take 15 mg by mouth daily at 6 (six) AM.   atenolol-chlorthalidone (TENORETIC) 50-25 MG tablet Take 1 tablet by mouth daily. (Patient not taking: Reported on 02/11/2020)   ibuprofen (ADVIL) 200 MG tablet Take 200-400 mg by mouth every 8 (eight) hours as needed (pain.). (Patient not taking: Reported on 02/11/2020)   No facility-administered  encounter medications on file as of 02/11/2020.    Allergies (verified) Patient has no known allergies.   History: Past Medical History:  Diagnosis Date   Arthritis    Cancer ([place])    SKIN   COVID-19    Diverticulitis    Edema    FEET/ANKLES   Hypertension    Liver disease    Past Surgical History:  Procedure Laterality Date   BACK SURGERY     CATARACT EXTRACTION W/PHACO Left 07/31/2018   Procedure: CATARACT EXTRACTION PHACO AND INTRAOCULAR LENS PLACEMENT ([place]);  Surgeon: Marchia Meiers, MD;  Location: [facility] ORS;  Service: Ophthalmology;  Laterality: Left;  [place] 00:37.4 CDE 5.06 Fluid Pack Lot # [id number]   CATARACT EXTRACTION W/PHACO Right 08/28/2018   Procedure: CATARACT EXTRACTION PHACO AND INTRAOCULAR LENS PLACEMENT ([facility]);  Surgeon: Marchia Meiers, MD;  Location: [facility] ORS;  Service: Ophthalmology;  Laterality: Right;  [place] 00:49.3 CDE 6.95 Fluid Pack Lot # [id number]   COLONOSCOPY WITH PROPOFOL N/A 09/20/2015   Procedure: COLONOSCOPY WITH PROPOFOL;  Surgeon: [name], MD;  Location: [facility] ENDOSCOPY;  Service: Endoscopy;  Laterality: N/A;   MICRODISCECTOMY LUMBAR  03/17/2013   VASECTOMY     Family History  Problem Relation Age of Onset   Heart disease Father    Pneumonia Mother    Cancer Maternal Uncle  Social History   Socioeconomic History   Marital status: Married    Spouse name: Not on file   Number of children: 2   Years of education: Not on file   Highest education level: 12th grade  Occupational History   Occupation: retired  Tobacco Use   Smoking status: Former Smoker    Types: Cigarettes   Smokeless tobacco: Never Used   Tobacco comment: > 50 years ago  Scientific laboratory technician Use: Never used  Substance and Sexual Activity   Alcohol use: Not Currently    Alcohol/week: 0.0 standard drinks   Drug use: No   Sexual activity: Not on file  Other Topics Concern   Not on file  Social History Narrative   Not on file   Social Determinants of Health    Financial Resource Strain: Low Risk    Difficulty of Paying Living Expenses: Not hard at all  Food Insecurity: No Food Insecurity   Worried About Charity fundraiser in the Last Year: Never true   [employer] in the Last Year: Never true  Transportation Needs: No Transportation Needs   Lack of Transportation (Medical): No   Lack of Transportation (Non-Medical): No  Physical Activity: Sufficiently Active   Days of Exercise per Week: 3 days   Minutes of Exercise per Session: 60 min  Stress: No Stress Concern Present   Feeling of Stress : Not at all  Social Connections: Socially Integrated   Frequency of Communication with Friends and Family: More than three times a week   Frequency of Social Gatherings with Friends and Family: More than three times a week   Attends Religious Services: More than 4 times per year   Active Member of [employer] or Organizations: Yes   Attends Music therapist: More than 4 times per year   Marital Status: Married    Tobacco Counseling Counseling given: Not Answered Comment: > 50 years ago   Clinical Intake:  Pre-visit preparation completed: Yes  Pain : No/denies pain Pain Score: 0-No pain     Nutritional Status: BMI > 30  Obese Nutritional Risks: None Diabetes: No  How often do you need to have someone help you when you read instructions, pamphlets, or other written materials from your doctor or pharmacy?: 1 - Never  Diabetic? No  Interpreter Needed?: No  Information entered by :: [facility], LPN   Activities of Daily Living In your present state of health, do you have any difficulty performing the following activities: 02/11/2020 02/12/2019  Hearing? Y N  Comment Does not wear hearing aids. -  Vision? N N  Difficulty concentrating or making decisions? N N  Walking or climbing stairs? N N  Dressing or bathing? N N  Doing errands, shopping? N N  Preparing Food and eating ? N N  Using the Toilet? N N  In the past six  months, have you accidently leaked urine? N N  Do you have problems with loss of bowel control? N N  Managing your Medications? N N  Managing your Finances? N N  Housekeeping or managing your Housekeeping? N N  Some recent data might be hidden    Patient Care Team: Chrismon, Driscilla Grammes as PCP - General (Physician Assistant) Marchia Meiers, MD as Consulting Physician (Ophthalmology) Lavonia Dana, MD as Consulting Physician (Nephrology) Dasher, Rayvon Char, MD (Dermatology)  Indicate any recent Medical Services you may have received from other than [facility] providers in the past year (date  may be approximate).     Assessment:   This is a routine wellness examination for [place].  Hearing/Vision screen No exam data present  Dietary issues and exercise activities discussed: Current Exercise Habits: Home exercise routine, Type of exercise: treadmill;strength training/weights;Other - see comments (eliptical), Time (Minutes): 60, Frequency (Times/Week): 3, Weekly Exercise (Minutes/Week): 180, Intensity: Moderate, Exercise limited by: None identified  Goals      Increase water intake     Recommend increasing water intake to 4-6 glasses a day.       Depression Screen PHQ 2/9 Scores 02/11/2020 02/12/2019 08/08/2017 08/08/2017 07/17/2016 07/17/2016 07/12/2015  PHQ - 2 Score 0 0 0 0 0 0 0  PHQ- 9 Score - - 0 - 0 - -    Fall Risk Fall Risk  02/11/2020 02/12/2019 08/08/2017 07/17/2016 07/12/2015  Falls in the past year? 0 0 No No No  Number falls in past yr: 0 0 - - -  Injury with Fall? 0 0 - - -    FALL RISK PREVENTION PERTAINING TO THE HOME:  Any stairs in or around the home? Yes  If so, are there any without handrails? No  Home free of loose throw rugs in walkways, pet beds, electrical cords, etc? Yes  Adequate lighting in your home to reduce risk of falls? Yes   ASSISTIVE DEVICES UTILIZED TO PREVENT FALLS:  Life alert? No  Use of a cane, walker or w/c? No  Grab bars in the bathroom? No   Shower chair or bench in shower? No  Elevated toilet seat or a handicapped toilet? No   TIMED UP AND GO:  Was the test performed? Yes .  Length of time to ambulate 10 feet: 8 sec.   Gait steady and fast without use of assistive device  Cognitive Function:     6CIT Screen 07/17/2016  What Year? 0 points  What month? 0 points  What time? 0 points  Count back from 20 0 points  Months in reverse 0 points  Repeat phrase 2 points  Total Score 2    Immunizations Immunization History  Administered Date(s) Administered   Influenza Split 10/09/2011   Influenza-Unspecified 09/01/2017   Pneumococcal Polysaccharide-23 10/09/2011   Tdap 10/19/2010    TDAP status: Up to date  Flu Vaccine status: Declined, Education has been provided regarding the importance of this vaccine but patient still declined. Advised may receive this vaccine at local pharmacy or Health Dept. Aware to provide a copy of the vaccination record if obtained from local pharmacy or Health Dept. Verbalized acceptance and understanding.  Pneumococcal vaccine status: Declined,  Education has been provided regarding the importance of this vaccine but patient still declined. Advised may receive this vaccine at local pharmacy or Health Dept. Aware to provide a copy of the vaccination record if obtained from local pharmacy or Health Dept. Verbalized acceptance and understanding.   Covid-19 vaccine status: Declined, Education has been provided regarding the importance of this vaccine but patient still declined. Advised may receive this vaccine at local pharmacy or Health Dept.or vaccine clinic. Aware to provide a copy of the vaccination record if obtained from local pharmacy or Health Dept. Verbalized acceptance and understanding.  Qualifies for Shingles Vaccine? Yes   Zostavax completed No   Shingrix Completed?: No.    Education has been provided regarding the importance of this vaccine. Patient has been advised to call  insurance company to determine out of pocket expense if they have not yet received this vaccine. Advised  may also receive vaccine at local pharmacy or Health Dept. Verbalized acceptance and understanding.  Screening Tests Health Maintenance  Topic Date Due   PNA vac Low Risk Adult (2 of 2 - PCV13) 02/12/2020 (Originally 10/08/2012)   COVID-19 Vaccine (1) 02/27/2020 (Originally 10/01/1957)   INFLUENZA VACCINE  03/31/2020 (Originally 08/02/2019)   TETANUS/TDAP  10/18/2020   Hepatitis C Screening  Completed    Health Maintenance  There are no preventive care reminders to display for this patient.  Colorectal cancer screening: No longer required.   Lung Cancer Screening: (Low Dose CT Chest recommended if Age 51-80 years, 30 pack-year currently smoking OR have quit w/in 15years.) does not qualify.   Additional Screening:  Hepatitis C Screening: Up to date  Vision Screening: Recommended annual ophthalmology exams for early detection of glaucoma and other disorders of the eye. Is the patient up to date with their annual eye exam?  Yes  Who is the provider or what is the name of the office in which the patient attends annual eye exams? Dr Neville Route @ [facility] If pt is not established with a provider, would they like to be referred to a provider to establish care? No .   Dental Screening: Recommended annual dental exams for proper oral hygiene  Community Resource Referral / Chronic Care Management: CRR required this visit?  No   CCM required this visit?  No      Plan:     I have personally reviewed and noted the following in the patient's chart:   Medical and social history Use of alcohol, tobacco or illicit drugs  Current medications and supplements Functional ability and status Nutritional status Physical activity Advanced directives List of other physicians Hospitalizations, surgeries, and ER visits in previous 12 months Vitals Screenings to include cognitive, depression, and  falls Referrals and appointments  In addition, I have reviewed and discussed with patient certain preventive protocols, quality metrics, and best practice recommendations. A written personalized care plan for preventive services as well as general preventive health recommendations were provided to patient.     Tamana Hatfield [place], [place]   [id number]   Nurse Notes: Pt declined receiving a Pneumonia, Covid or flu shot.   Reviewed screening note and recommendations of Nurse Health Advisor. Agree with documentation and plan.

## 2020-02-11 ENCOUNTER — Ambulatory Visit (INDEPENDENT_AMBULATORY_CARE_PROVIDER_SITE_OTHER): Payer: Medicare HMO

## 2020-02-11 ENCOUNTER — Other Ambulatory Visit: Payer: Self-pay

## 2020-02-11 VITALS — BP 156/58 | HR 75 | Temp 98.7°F | Ht 67.0 in | Wt 192.4 lb

## 2020-02-11 DIAGNOSIS — Z0000 Encounter for general adult medical examination without abnormal findings: Secondary | ICD-10-CM | POA: Diagnosis not present

## 2020-02-11 NOTE — Patient Instructions (Signed)
Kenneth Collins , Thank you for taking time to come for your Medicare Wellness Visit. I appreciate your ongoing commitment to your health goals. Please review the following plan we discussed and let me know if I can assist you in the future.   Screening recommendations/referrals: Colonoscopy: No longer required.  Recommended yearly ophthalmology/optometry visit for glaucoma screening and checkup Recommended yearly dental visit for hygiene and checkup  Vaccinations: Influenza vaccine: Currently due, declined receiving.  Pneumococcal vaccine: Currently due, declined receiving.  Tdap vaccine: Up to date, due 10/2020 Shingles vaccine: Shingrix discussed. Please contact your pharmacy for coverage information.     Advanced directives: Advance directive discussed with you today. Even though you declined this today please call our office should you change your mind and we can give you the proper paperwork for you to fill out.  Conditions/risks identified: Recommend increasing water intake to 6-8 8 oz glasses a day.   Next appointment: 05/12/20 @ 10:40 AM with [place] 65 Years and Older, Male Preventive care refers to lifestyle choices and visits with your health care provider that can promote health and wellness. What does preventive care include?  A yearly physical exam. This is also called an annual well check.  Dental exams once or twice a year.  Routine eye exams. Ask your health care provider how often you should have your eyes checked.  Personal lifestyle choices, including:  Daily care of your teeth and gums.  Regular physical activity.  Eating a healthy diet.  Avoiding tobacco and drug use.  Limiting alcohol use.  Practicing safe sex.  Taking low doses of aspirin every day.  Taking vitamin and mineral supplements as recommended by your health care provider. What happens during an annual well check? The services and screenings done by your health care  provider during your annual well check will depend on your age, overall health, lifestyle risk factors, and family history of disease. Counseling  Your health care provider may ask you questions about your:  Alcohol use.  Tobacco use.  Drug use.  Emotional well-being.  Home and relationship well-being.  Sexual activity.  Eating habits.  History of falls.  Memory and ability to understand (cognition).  Work and work Statistician. Screening  You may have the following tests or measurements:  Height, weight, and BMI.  Blood pressure.  Lipid and cholesterol levels. These may be checked every 5 years, or more frequently if you are over 46 years old.  Skin check.  Lung cancer screening. You may have this screening every year starting at age 41 if you have a 30-pack-year history of smoking and currently smoke or have quit within the past 15 years.  Fecal occult blood test (FOBT) of the stool. You may have this test every year starting at age 75.  Flexible sigmoidoscopy or colonoscopy. You may have a sigmoidoscopy every 5 years or a colonoscopy every 10 years starting at age 12.  Prostate cancer screening. Recommendations will vary depending on your family history and other risks.  Hepatitis C blood test.  Hepatitis B blood test.  Sexually transmitted disease (STD) testing.  Diabetes screening. This is done by checking your blood sugar (glucose) after you have not eaten for a while (fasting). You may have this done every 1-3 years.  Abdominal aortic aneurysm (AAA) screening. You may need this if you are a current or former smoker.  Osteoporosis. You may be screened starting at age 34 if you are at high risk. Talk with  your health care provider about your test results, treatment options, and if necessary, the need for more tests. Vaccines  Your health care provider may recommend certain vaccines, such as:  Influenza vaccine. This is recommended every year.  Tetanus,  diphtheria, and acellular pertussis (Tdap, Td) vaccine. You may need a Td booster every 10 years.  Zoster vaccine. You may need this after age 53.  Pneumococcal 13-valent conjugate (PCV13) vaccine. One dose is recommended after age 67.  Pneumococcal polysaccharide (PPSV23) vaccine. One dose is recommended after age 32. Talk to your health care provider about which screenings and vaccines you need and how often you need them. This information is not intended to replace advice given to you by your health care provider. Make sure you discuss any questions you have with your health care provider. Document Released: 01/14/2015 Document Revised: 09/07/2015 Document Reviewed: 10/19/2014 Elsevier Interactive Patient Education  2017 [place] Prevention in the Home Falls can cause injuries. They can happen to people of all ages. There are many things you can do to make your home safe and to help prevent falls. What can I do on the outside of my home?  Regularly fix the edges of walkways and driveways and fix any cracks.  Remove anything that might make you trip as you walk through a door, such as a raised step or threshold.  Trim any bushes or trees on the path to your home.  Use bright outdoor lighting.  Clear any walking paths of anything that might make someone trip, such as rocks or tools.  Regularly check to see if handrails are loose or broken. Make sure that both sides of any steps have handrails.  Any raised decks and porches should have guardrails on the edges.  Have any leaves, snow, or ice cleared regularly.  Use sand or salt on walking paths during winter.  Clean up any spills in your garage right away. This includes oil or grease spills. What can I do in the bathroom?  Use night lights.  Install grab bars by the toilet and in the tub and shower. Do not use towel bars as grab bars.  Use non-skid mats or decals in the tub or shower.  If you need to sit down in  the shower, use a plastic, non-slip stool.  Keep the floor dry. Clean up any water that spills on the floor as soon as it happens.  Remove soap buildup in the tub or shower regularly.  Attach bath mats securely with double-sided non-slip rug tape.  Do not have throw rugs and other things on the floor that can make you trip. What can I do in the bedroom?  Use night lights.  Make sure that you have a light by your bed that is easy to reach.  Do not use any sheets or blankets that are too big for your bed. They should not hang down onto the floor.  Have a firm chair that has side arms. You can use this for support while you get dressed.  Do not have throw rugs and other things on the floor that can make you trip. What can I do in the kitchen?  Clean up any spills right away.  Avoid walking on wet floors.  Keep items that you use a lot in easy-to-reach places.  If you need to reach something above you, use a strong step stool that has a grab bar.  Keep electrical cords out of the way.  Do not  use floor polish or wax that makes floors slippery. If you must use wax, use non-skid floor wax.  Do not have throw rugs and other things on the floor that can make you trip. What can I do with my stairs?  Do not leave any items on the stairs.  Make sure that there are handrails on both sides of the stairs and use them. Fix handrails that are broken or loose. Make sure that handrails are as long as the stairways.  Check any carpeting to make sure that it is firmly attached to the stairs. Fix any carpet that is loose or worn.  Avoid having throw rugs at the top or bottom of the stairs. If you do have throw rugs, attach them to the floor with carpet tape.  Make sure that you have a light switch at the top of the stairs and the bottom of the stairs. If you do not have them, ask someone to add them for you. What else can I do to help prevent falls?  Wear shoes that:  Do not have high  heels.  Have rubber bottoms.  Are comfortable and fit you well.  Are closed at the toe. Do not wear sandals.  If you use a stepladder:  Make sure that it is fully opened. Do not climb a closed stepladder.  Make sure that both sides of the stepladder are locked into place.  Ask someone to hold it for you, if possible.  Clearly mark and make sure that you can see:  Any grab bars or handrails.  First and last steps.  Where the edge of each step is.  Use tools that help you move around (mobility aids) if they are needed. These include:  Canes.  Walkers.  Scooters.  Crutches.  Turn on the lights when you go into a dark area. Replace any light bulbs as soon as they burn out.  Set up your furniture so you have a clear path. Avoid moving your furniture around.  If any of your floors are uneven, fix them.  If there are any pets around you, be aware of where they are.  Review your medicines with your doctor. Some medicines can make you feel dizzy. This can increase your chance of falling. Ask your doctor what other things that you can do to help prevent falls. This information is not intended to replace advice given to you by your health care provider. Make sure you discuss any questions you have with your health care provider. Document Released: 10/14/2008 Document Revised: 05/26/2015 Document Reviewed: 01/22/2014 Elsevier Interactive Patient Education  2017 [employer].

## 2020-03-21 ENCOUNTER — Ambulatory Visit: Payer: Self-pay

## 2020-03-21 ENCOUNTER — Ambulatory Visit: Payer: Self-pay | Admitting: *Deleted

## 2020-03-21 NOTE — Telephone Encounter (Signed)
Pt. Calling again to report he called [facility] and "they said all they could do is an EKG. I want to be seen in the office if possible.: Refuses ED. Denies any chest pain since Saturday. States "it's more of discomfort with exertion." No nausea or dizziness. Mild shortness of breath with this. Last 5 minutes and "then goes away." Warm transfer to [place] in the practice. Instructed pt. To go to ED if chest pain returns. Verbalizes understanding. Answer Assessment - Initial Assessment Questions 1. LOCATION: "Where does it hurt?"       Hurts top of chest shoulder to shoulder 2. RADIATION: "Does the pain go anywhere else?" (e.g., into neck, jaw, arms, back)     No 3. ONSET: "When did the chest pain begin?" (Minutes, hours or days)      2 -3 weeks ago 4. PATTERN "Does the pain come and go, or has it been constant since it started?"  "Does it get worse with exertion?"      Comes and goes 5. DURATION: "How long does it last" (e.g., seconds, minutes, hours)     Minutes - 5 6. SEVERITY: "How bad is the pain?"  (e.g., Scale 1-10; mild, moderate, or severe)    - MILD (1-3): doesn't interfere with normal activities     - MODERATE (4-7): interferes with normal activities or awakens from sleep    - SEVERE (8-10): excruciating pain, unable to do any normal activities       3-4 7. CARDIAC RISK FACTORS: "Do you have any history of heart problems or risk factors for heart disease?" (e.g., angina, prior heart attack; diabetes, high blood pressure, high cholesterol, smoker, or strong family history of heart disease)     HTN 8. PULMONARY RISK FACTORS: "Do you have any history of lung disease?"  (e.g., blood clots in lung, asthma, emphysema, birth control pills)     No 9. CAUSE: "What do you think is causing the chest pain?"     Unsure 10. OTHER SYMPTOMS: "Do you have any other symptoms?" (e.g., dizziness, nausea, vomiting, sweating, fever, difficulty breathing, cough)       No 11. PREGNANCY: "Is there any chance you  are pregnant?" "When was your last menstrual period?"       n/a  Protocols used: CHEST PAIN-A-AH

## 2020-03-21 NOTE — Telephone Encounter (Signed)
Patient is reporting tightness and aching across chest with exertion. Patient states he has been having symptoms for 1 month- and they have been getting more frequent with his activity. Patient states he has chest tightness and fatigue - usually lasting 5 minutes and gets better with rest. No symptoms now- call to office- no open appointments- advised UC for follow up to his symptoms.  Reason for Disposition . [1] Chest pain lasting < 5 minutes AND [2] NO chest pain or cardiac symptoms (e.g., breathing difficulty, sweating) now (Exception: chest pains that last only a few seconds)  Answer Assessment - Initial Assessment Questions 1. LOCATION: "Where does it hurt?"       Across the chest- top of shoulders- weakness 2. RADIATION: "Does the pain go anywhere else?" (e.g., into neck, jaw, arms, back)     Stays in that area 3. ONSET: "When did the chest pain begin?" (Minutes, hours or days)      Intermittent- 1 month 4. PATTERN "Does the pain come and go, or has it been constant since it started?"  "Does it get worse with exertion?"      Comes and goes, exertion-causes occurence 5. DURATION: "How long does it last" (e.g., seconds, minutes, hours)    5 minutes and goes away with rest 6. SEVERITY: "How bad is the pain?"  (e.g., Scale 1-10; mild, moderate, or severe)    - MILD (1-3): doesn't interfere with normal activities     - MODERATE (4-7): interferes with normal activities or awakens from sleep    - SEVERE (8-10): excruciating pain, unable to do any normal activities       moderate 7. CARDIAC RISK FACTORS: "Do you have any history of heart problems or risk factors for heart disease?" (e.g., angina, prior heart attack; diabetes, high blood pressure, high cholesterol, smoker, or strong family history of heart disease)     High blood pressure 8. PULMONARY RISK FACTORS: "Do you have any history of lung disease?"  (e.g., blood clots in lung, asthma, emphysema, birth control pills)     no 9. CAUSE:  "What do you think is causing the chest pain?"     angina 10. OTHER SYMPTOMS: "Do you have any other symptoms?" (e.g., dizziness, nausea, vomiting, sweating, fever, difficulty breathing, cough)       Some difficulty with breathing- cold related, tightness achy feeling with chest tightness 11. PREGNANCY: "Is there any chance you are pregnant?" "When was your last menstrual period?"       n/a  Protocols used: CHEST PAIN-A-AH

## 2020-03-22 DIAGNOSIS — R0602 Shortness of breath: Secondary | ICD-10-CM | POA: Diagnosis not present

## 2020-03-22 DIAGNOSIS — I10 Essential (primary) hypertension: Secondary | ICD-10-CM | POA: Diagnosis not present

## 2020-03-22 DIAGNOSIS — N1832 Chronic kidney disease, stage 3b: Secondary | ICD-10-CM | POA: Diagnosis not present

## 2020-03-22 DIAGNOSIS — R079 Chest pain, unspecified: Secondary | ICD-10-CM | POA: Diagnosis not present

## 2020-03-22 NOTE — Telephone Encounter (Addendum)
Spoke with patient and he was seen this morning by cardiology and is proceeding with work up

## 2020-03-22 NOTE — Telephone Encounter (Signed)
Proceed with cardiology referral today.

## 2020-03-25 ENCOUNTER — Ambulatory Visit: Payer: Self-pay | Admitting: Adult Health

## 2020-03-29 DIAGNOSIS — H43813 Vitreous degeneration, bilateral: Secondary | ICD-10-CM | POA: Diagnosis not present

## 2020-04-05 DIAGNOSIS — I129 Hypertensive chronic kidney disease with stage 1 through stage 4 chronic kidney disease, or unspecified chronic kidney disease: Secondary | ICD-10-CM | POA: Diagnosis not present

## 2020-04-05 DIAGNOSIS — R809 Proteinuria, unspecified: Secondary | ICD-10-CM | POA: Diagnosis not present

## 2020-04-05 DIAGNOSIS — N1832 Chronic kidney disease, stage 3b: Secondary | ICD-10-CM | POA: Diagnosis not present

## 2020-04-05 DIAGNOSIS — R609 Edema, unspecified: Secondary | ICD-10-CM | POA: Diagnosis not present

## 2020-04-05 DIAGNOSIS — N2581 Secondary hyperparathyroidism of renal origin: Secondary | ICD-10-CM | POA: Diagnosis not present

## 2020-05-04 DIAGNOSIS — R079 Chest pain, unspecified: Secondary | ICD-10-CM | POA: Diagnosis not present

## 2020-05-04 DIAGNOSIS — R0602 Shortness of breath: Secondary | ICD-10-CM | POA: Diagnosis not present

## 2020-05-11 DIAGNOSIS — R0602 Shortness of breath: Secondary | ICD-10-CM | POA: Diagnosis not present

## 2020-05-11 DIAGNOSIS — I10 Essential (primary) hypertension: Secondary | ICD-10-CM | POA: Diagnosis not present

## 2020-05-11 DIAGNOSIS — R079 Chest pain, unspecified: Secondary | ICD-10-CM | POA: Diagnosis not present

## 2020-05-11 DIAGNOSIS — N1832 Chronic kidney disease, stage 3b: Secondary | ICD-10-CM | POA: Diagnosis not present

## 2020-05-12 ENCOUNTER — Ambulatory Visit (INDEPENDENT_AMBULATORY_CARE_PROVIDER_SITE_OTHER): Payer: Medicare HMO | Admitting: Family Medicine

## 2020-05-12 ENCOUNTER — Encounter: Payer: Self-pay | Admitting: Family Medicine

## 2020-05-12 ENCOUNTER — Other Ambulatory Visit: Payer: Self-pay

## 2020-05-12 VITALS — BP 145/77 | HR 63 | Temp 98.7°F | Resp 16 | Ht 67.0 in | Wt 187.6 lb

## 2020-05-12 DIAGNOSIS — R079 Chest pain, unspecified: Secondary | ICD-10-CM | POA: Diagnosis not present

## 2020-05-12 DIAGNOSIS — N1832 Chronic kidney disease, stage 3b: Secondary | ICD-10-CM | POA: Diagnosis not present

## 2020-05-12 DIAGNOSIS — I10 Essential (primary) hypertension: Secondary | ICD-10-CM

## 2020-05-12 DIAGNOSIS — Z0000 Encounter for general adult medical examination without abnormal findings: Secondary | ICD-10-CM | POA: Diagnosis not present

## 2020-05-12 NOTE — Progress Notes (Signed)
Complete physical exam   Patient: Kenneth Collins   DOB: 12-Dec-1945   75 y.o. Male  MRN: [id number] Visit Date: 05/12/2020  Today's healthcare provider: Vernie Murders, PA-C   Chief Complaint  Patient presents with  . Annual Exam   Subjective    Kenneth Collins is a 75 y.o. male who presents today for a complete physical exam.  He reports consuming a general diet. Home exercise routine includes treadmill and exercise bike and weight lifting 3 times per week. He generally feels fairly well. He reports sleeping fairly well. He does not have additional problems to discuss today.   Had AWV with [facility] on 02/11/2020    Past Medical History:  Diagnosis Date  . Arthritis   . Cancer ([place])    SKIN  . COVID-19   . Diverticulitis   . Edema    FEET/ANKLES  . Hypertension   . Liver disease    Past Surgical History:  Procedure Laterality Date  . BACK SURGERY    . CATARACT EXTRACTION W/PHACO Left 07/31/2018   Procedure: CATARACT EXTRACTION PHACO AND INTRAOCULAR LENS PLACEMENT (IOC);  Surgeon: Marchia Meiers, MD;  Location: [facility];  Service: Ophthalmology;  Laterality: Left;  [place] 00:37.4 CDE 5.06 Fluid Pack Lot # [id number]  . CATARACT EXTRACTION W/PHACO Right 08/28/2018   Procedure: CATARACT EXTRACTION PHACO AND INTRAOCULAR LENS PLACEMENT ([facility]);  Surgeon: Marchia Meiers, MD;  Location: [facility];  Service: Ophthalmology;  Laterality: Right;  [place] 00:49.3 CDE 6.95 Fluid Pack Lot # [id number]  . COLONOSCOPY WITH PROPOFOL N/A 09/20/2015   Procedure: COLONOSCOPY WITH PROPOFOL;  Surgeon: [name], MD;  Location: [facility];  Service: Endoscopy;  Laterality: N/A;  . MICRODISCECTOMY LUMBAR  03/17/2013  . VASECTOMY     Social History   Socioeconomic History  . Marital status: Married    Spouse name: Not on file  . Number of children: 2  . Years of education: Not on file  . Highest education level: 12th grade  Occupational History  . Occupation: retired  Tobacco Use  . Smoking status:  Former Smoker    Types: Cigarettes  . Smokeless tobacco: Never Used  . Tobacco comment: > 50 years ago  Vaping Use  . Vaping Use: Never used  Substance and Sexual Activity  . Alcohol use: Not Currently    Alcohol/week: 0.0 standard drinks  . Drug use: No  . Sexual activity: Not on file  Other Topics Concern  . Not on file  Social History Narrative  . Not on file   Social Determinants of Health   Financial Resource Strain: Low Risk   . Difficulty of Paying Living Expenses: Not hard at all  Food Insecurity: No Food Insecurity  . Worried About Charity fundraiser in the Last Year: Never true  . Ran Out of Food in the Last Year: Never true  Transportation Needs: No Transportation Needs  . Lack of Transportation (Medical): No  . Lack of Transportation (Non-Medical): No  Physical Activity: Sufficiently Active  . Days of Exercise per Week: 3 days  . Minutes of Exercise per Session: 60 min  Stress: No Stress Concern Present  . Feeling of Stress : Not at all  Social Connections: Socially Integrated  . Frequency of Communication with Friends and Family: More than three times a week  . Frequency of Social Gatherings with Friends and Family: More than three times a week  . Attends Religious Services: More than 4 times per year  . Active  Member of Clubs or Organizations: Yes  . Attends Archivist Meetings: More than 4 times per year  . Marital Status: Married  Human resources officer Violence: Not At Risk  . Fear of Current or Ex-Partner: No  . Emotionally Abused: No  . Physically Abused: No  . Sexually Abused: No   Family Status  Relation Name Status  . Father  Deceased  . Mother  Deceased  . Sister  Alive  . Mat Uncle  (Not Specified)   Family History  Problem Relation Age of Onset  . Heart disease Father   . Pneumonia Mother   . Cancer Maternal Uncle    No Known Allergies  Patient Care Team: Armoni Depass, Vickki Muff, PA-C as PCP - General (Physician Assistant) Marchia Meiers, MD as Consulting Physician (Ophthalmology) Lavonia Dana, MD as Consulting Physician (Nephrology) Dasher, Rayvon Char, MD (Dermatology)   Medications: Outpatient Medications Prior to Visit  Medication Sig  . acetaminophen (TYLENOL) 500 MG tablet Take 500 mg by mouth daily at 6 (six) AM. As needed for mild pain.  Marland Kitchen amLODipine (NORVASC) 2.5 MG tablet Take 2.5 mg by mouth daily.  . Ascorbic Acid (VITAMIN C) 1000 MG tablet Take 1,000 mg by mouth daily.  Marland Kitchen aspirin EC 81 MG tablet Take 81 mg by mouth daily.  . Cholecalciferol (DIALYVITE VITAMIN D 5000) 125 MCG (5000 UT) capsule Take 5,000 Units by mouth daily.  . irbesartan (AVAPRO) 300 MG tablet Take 300 mg by mouth daily.  . metoprolol succinate (TOPROL-XL) 25 MG 24 hr tablet Take 1 tablet by mouth daily.  . Zinc Sulfate (ZINC 15 PO) Take 15 mg by mouth daily at 6 (six) AM.  . [DISCONTINUED] atenolol-chlorthalidone (TENORETIC) 50-25 MG tablet Take 1 tablet by mouth daily.  . [DISCONTINUED] ibuprofen (ADVIL) 200 MG tablet Take 200-400 mg by mouth every 8 (eight) hours as needed (pain.). (Patient not taking: No sig reported)   No facility-administered medications prior to visit.    Review of Systems  Constitutional: Positive for fatigue. Negative for appetite change, chills and fever.  HENT: Negative for congestion, ear pain, hearing loss, nosebleeds and trouble swallowing.   Eyes: Negative for pain and visual disturbance.  Respiratory: Negative for cough, chest tightness and shortness of breath.   Cardiovascular: Positive for chest pain. Negative for palpitations and leg swelling.  Gastrointestinal: Negative for abdominal pain, blood in stool, constipation, diarrhea, nausea and vomiting.  Endocrine: Negative for polydipsia, polyphagia and polyuria.  Genitourinary: Negative for dysuria and flank pain.  Musculoskeletal: Negative for arthralgias, back pain, joint swelling, myalgias and neck stiffness.  Skin: Negative for color change,  rash and wound.  Neurological: Negative for dizziness, tremors, seizures, speech difficulty, weakness, light-headedness and headaches.  Psychiatric/Behavioral: Negative for behavioral problems, confusion, decreased concentration, dysphoric mood and sleep disturbance. The patient is not nervous/anxious.   All other systems reviewed and are negative.     Objective    BP (!) 145/77 (BP Location: Left Arm, Patient Position: Sitting, Cuff Size: Normal)   Pulse 63   Temp 98.7 F (37.1 C) (Temporal)   Resp 16   Ht 5\' 7"  (1.702 m)   Wt 187 lb 9.6 oz (85.1 kg)   SpO2 97% Comment: room air  BMI 29.38 kg/m  BP Readings from Last 3 Encounters:  05/12/20 (!) 145/77  02/11/20 (!) 156/58  05/12/19 132/70   Wt Readings from Last 3 Encounters:  05/12/20 187 lb 9.6 oz (85.1 kg)  02/11/20 192 lb 6.4 oz (87.3 kg)  05/12/19 182 lb (82.6 kg)     Today's Vitals   05/12/20 1127 05/12/20 1131  BP: (!) 143/69 (!) 145/77  Pulse: 63   Resp: 16   Temp: 98.7 F (37.1 C)   TempSrc: Temporal   SpO2: 97%   Weight: 187 lb 9.6 oz (85.1 kg)   Height: 5\' 7"  (1.702 m)    Body mass index is 29.38 kg/m.  Physical Exam Constitutional:      Appearance: He is well-developed.  HENT:     Head: Normocephalic and atraumatic.     Right Ear: External ear normal.     Left Ear: External ear normal.     Nose: Nose normal.  Eyes:     General:        Right eye: No discharge.     Conjunctiva/sclera: Conjunctivae normal.     Pupils: Pupils are equal, round, and reactive to light.  Neck:     Thyroid: No thyromegaly.     Trachea: No tracheal deviation.  Cardiovascular:     Rate and Rhythm: Normal rate and regular rhythm.     Heart sounds: Normal heart sounds. No murmur heard.   Pulmonary:     Effort: Pulmonary effort is normal. No respiratory distress.     Breath sounds: Normal breath sounds. No wheezing or rales.  Chest:     Chest wall: No tenderness.  Abdominal:     General: There is no distension.      Palpations: Abdomen is soft. There is no mass.     Tenderness: There is no abdominal tenderness. There is no guarding or rebound.  Musculoskeletal:        General: No tenderness. Normal range of motion.     Cervical back: Normal range of motion and neck supple.  Lymphadenopathy:     Cervical: No cervical adenopathy.  Skin:    General: Skin is warm and dry.     Findings: No erythema or rash.  Neurological:     Mental Status: He is alert and oriented to person, place, and time.     Cranial Nerves: No cranial nerve deficit.     Motor: No abnormal muscle tone.     Coordination: Coordination normal.     Deep Tendon Reflexes: Reflexes are normal and symmetric. Reflexes normal.  Psychiatric:        Behavior: Behavior normal.        Thought Content: Thought content normal.        Judgment: Judgment normal.       Last depression screening scores PHQ 2/9 Scores 05/12/2020 02/11/2020 02/12/2019  PHQ - 2 Score 0 0 0  PHQ- 9 Score 0 - -   Last fall risk screening Fall Risk  05/12/2020  Falls in the past year? 0  Number falls in past yr: 0  Injury with Fall? 0  Follow up Falls evaluation completed   Last Audit-C alcohol use screening Alcohol Use Disorder Test (AUDIT) 05/12/2020  1. How often do you have a drink containing alcohol? 0  2. How many drinks containing alcohol do you have on a typical day when you are drinking? 0  3. How often do you have six or more drinks on one occasion? 0  AUDIT-C Score 0  Alcohol Brief Interventions/Follow-up -   A score of 3 or more in women, and 4 or more in men indicates increased risk for alcohol abuse, EXCEPT if all of the points are from question 1   No results found for  any visits on 05/12/20.  Assessment & Plan    Routine Health Maintenance and Physical Exam  Exercise Activities and Dietary recommendations Goals    . Increase water intake     Recommend increasing water intake to 4-6 glasses a day.       Immunization History   Administered Date(s) Administered  . Influenza Split 10/09/2011  . Influenza-Unspecified 09/01/2017  . Pneumococcal Polysaccharide-23 10/09/2011  . Tdap 10/19/2010    Health Maintenance  Topic Date Due  . COVID-19 Vaccine (1) Never done  . PNA vac Low Risk Adult (2 of 2 - PCV13) 10/08/2012  . INFLUENZA VACCINE  08/01/2020  . TETANUS/TDAP  10/18/2020  . Hepatitis C Screening  Completed  . HPV VACCINES  Aged Out    Discussed health benefits of physical activity, and encouraged him to engage in regular exercise appropriate for his age and condition.  1. Chest pain with high risk for cardiac etiology Recurrence of angina relieved by use of NTG and Imdur. Scheduled to have cardiac cath and stent by Dr. Saralyn Pilar (cardiologist). Recheck labs. - CBC with Differential/Platelet - Comprehensive metabolic panel - Lipid panel - TSH  2. Stage 3b chronic kidney disease ([place]) Followed by nephrologist (Dr. Cyndia Diver). Recheck routine labs. - CBC with Differential/Platelet - Comprehensive metabolic panel  3. Essential (primary) hypertension Well controlled by Amlodipine and Metoprolol. Continue follow up with cardiologist and recheck routine labs. - CBC with Differential/Platelet - Comprehensive metabolic panel - Lipid panel - TSH  4. Encounter for Medicare annual wellness exam Good general health. Declines immunizations today.   No follow-ups on file.     I, Kirsta Probert, PA-C, have reviewed all documentation for this visit. The documentation on 05/12/20 for the exam, diagnosis, procedures, and orders are all accurate and complete.    Vernie Murders, PA-C  [employer] [phone] (phone) [phone] (fax)  [place]

## 2020-05-16 DIAGNOSIS — N1832 Chronic kidney disease, stage 3b: Secondary | ICD-10-CM | POA: Diagnosis not present

## 2020-05-16 DIAGNOSIS — R079 Chest pain, unspecified: Secondary | ICD-10-CM | POA: Diagnosis not present

## 2020-05-16 DIAGNOSIS — I10 Essential (primary) hypertension: Secondary | ICD-10-CM | POA: Diagnosis not present

## 2020-05-17 DIAGNOSIS — R609 Edema, unspecified: Secondary | ICD-10-CM | POA: Diagnosis not present

## 2020-05-17 DIAGNOSIS — R809 Proteinuria, unspecified: Secondary | ICD-10-CM | POA: Diagnosis not present

## 2020-05-17 DIAGNOSIS — I129 Hypertensive chronic kidney disease with stage 1 through stage 4 chronic kidney disease, or unspecified chronic kidney disease: Secondary | ICD-10-CM | POA: Diagnosis not present

## 2020-05-17 DIAGNOSIS — N1832 Chronic kidney disease, stage 3b: Secondary | ICD-10-CM | POA: Diagnosis not present

## 2020-05-17 DIAGNOSIS — N2581 Secondary hyperparathyroidism of renal origin: Secondary | ICD-10-CM | POA: Diagnosis not present

## 2020-05-17 LAB — CBC WITH DIFFERENTIAL/PLATELET
Basophils Absolute: 0 10*3/uL (ref 0.0–0.2)
Basos: 0 %
EOS (ABSOLUTE): 0.3 10*3/uL (ref 0.0–0.4)
Eos: 4 %
Hematocrit: 36.2 % — ABNORMAL LOW (ref 37.5–51.0)
Hemoglobin: 12.6 g/dL — ABNORMAL LOW (ref 13.0–17.7)
Immature Grans (Abs): 0 10*3/uL (ref 0.0–0.1)
Immature Granulocytes: 0 %
Lymphocytes Absolute: 1.7 10*3/uL (ref 0.7–3.1)
Lymphs: 22 %
MCH: 31.7 pg (ref 26.6–33.0)
MCHC: 34.8 g/dL (ref 31.5–35.7)
MCV: 91 fL (ref 79–97)
Monocytes Absolute: 0.6 10*3/uL (ref 0.1–0.9)
Monocytes: 7 %
Neutrophils Absolute: 5.2 10*3/uL (ref 1.4–7.0)
Neutrophils: 67 %
Platelets: 200 10*3/uL (ref 150–450)
RBC: 3.98 x10E6/uL — ABNORMAL LOW (ref 4.14–5.80)
RDW: 12.4 % (ref 11.6–15.4)
WBC: 7.8 10*3/uL (ref 3.4–10.8)

## 2020-05-17 LAB — COMPREHENSIVE METABOLIC PANEL
ALT: 15 IU/L (ref 0–44)
AST: 12 IU/L (ref 0–40)
Albumin/Globulin Ratio: 1.3 (ref 1.2–2.2)
Albumin: 3.3 g/dL — ABNORMAL LOW (ref 3.7–4.7)
Alkaline Phosphatase: 73 IU/L (ref 44–121)
BUN/Creatinine Ratio: 19 (ref 10–24)
BUN: 34 mg/dL — ABNORMAL HIGH (ref 8–27)
Bilirubin Total: 0.4 mg/dL (ref 0.0–1.2)
CO2: 23 mmol/L (ref 20–29)
Calcium: 8.3 mg/dL — ABNORMAL LOW (ref 8.6–10.2)
Chloride: 109 mmol/L — ABNORMAL HIGH (ref 96–106)
Creatinine, Ser: 1.83 mg/dL — ABNORMAL HIGH (ref 0.76–1.27)
Globulin, Total: 2.5 g/dL (ref 1.5–4.5)
Glucose: 97 mg/dL (ref 65–99)
Potassium: 4.9 mmol/L (ref 3.5–5.2)
Sodium: 141 mmol/L (ref 134–144)
Total Protein: 5.8 g/dL — ABNORMAL LOW (ref 6.0–8.5)
eGFR: 38 mL/min/{1.73_m2} — ABNORMAL LOW (ref >59)

## 2020-05-17 LAB — LIPID PANEL
Chol/HDL Ratio: 3.3 ratio (ref 0.0–5.0)
Cholesterol, Total: 141 mg/dL (ref 100–199)
HDL: 43 mg/dL (ref >39)
LDL Chol Calc (NIH): 85 mg/dL (ref 0–99)
Triglycerides: 63 mg/dL (ref 0–149)
VLDL Cholesterol Cal: 13 mg/dL (ref 5–40)

## 2020-05-17 LAB — TSH: TSH: 2.31 u[IU]/mL (ref 0.450–4.500)

## 2020-06-02 DIAGNOSIS — I10 Essential (primary) hypertension: Secondary | ICD-10-CM | POA: Diagnosis not present

## 2020-06-02 DIAGNOSIS — R079 Chest pain, unspecified: Secondary | ICD-10-CM | POA: Diagnosis not present

## 2020-06-02 DIAGNOSIS — N1832 Chronic kidney disease, stage 3b: Secondary | ICD-10-CM | POA: Diagnosis not present

## 2020-06-02 DIAGNOSIS — R0602 Shortness of breath: Secondary | ICD-10-CM | POA: Diagnosis not present

## 2020-06-14 ENCOUNTER — Encounter
Admission: RE | Disposition: A | Discharge status: Home / Self Care | Payer: Self-pay | Source: Home / Self Care | Attending: Cardiology

## 2020-06-14 ENCOUNTER — Ambulatory Visit
Admission: RE | Admit: 2020-06-14 | Discharge: 2020-06-14 | Disposition: A | Discharge status: Home / Self Care | Payer: Medicare HMO | Attending: Cardiology | Admitting: Cardiology

## 2020-06-14 ENCOUNTER — Other Ambulatory Visit: Payer: Self-pay

## 2020-06-14 ENCOUNTER — Encounter: Payer: Self-pay | Admitting: Cardiology

## 2020-06-14 DIAGNOSIS — I25110 Atherosclerotic heart disease of native coronary artery with unstable angina pectoris: Secondary | ICD-10-CM | POA: Insufficient documentation

## 2020-06-14 DIAGNOSIS — I200 Unstable angina: Secondary | ICD-10-CM

## 2020-06-14 DIAGNOSIS — I25119 Atherosclerotic heart disease of native coronary artery with unspecified angina pectoris: Secondary | ICD-10-CM | POA: Diagnosis not present

## 2020-06-14 HISTORY — PX: LEFT HEART CATH AND CORONARY ANGIOGRAPHY: CATH118249

## 2020-06-14 SURGERY — LEFT HEART CATH AND CORONARY ANGIOGRAPHY
Anesthesia: Moderate Sedation

## 2020-06-14 MED ORDER — IOHEXOL 300 MG/ML  SOLN
INTRAMUSCULAR | Status: DC | PRN
  Administered 2020-06-14: 68 mL

## 2020-06-14 MED ORDER — MIDAZOLAM HCL 2 MG/2ML IJ SOLN
INTRAMUSCULAR | Status: DC | PRN
Start: 2020-06-14 — End: 2020-06-14
  Administered 2020-06-14: 1 mg via INTRAVENOUS

## 2020-06-14 MED ORDER — HEPARIN (PORCINE) IN NACL 1000-0.9 UT/500ML-% IV SOLN
INTRAVENOUS | Status: AC
  Filled 2020-06-14: qty 1000

## 2020-06-14 MED ORDER — FENTANYL CITRATE (PF) 100 MCG/2ML IJ SOLN
INTRAMUSCULAR | Status: AC
  Filled 2020-06-14: qty 2

## 2020-06-14 MED ORDER — HEPARIN SODIUM (PORCINE) 1000 UNIT/ML IJ SOLN
INTRAMUSCULAR | Status: AC
  Filled 2020-06-14: qty 1

## 2020-06-14 MED ORDER — SODIUM CHLORIDE 0.9 % WEIGHT BASED INFUSION: 1.0000 mL/kg/h | INTRAVENOUS | Status: DC

## 2020-06-14 MED ORDER — LABETALOL HCL 5 MG/ML IV SOLN: 10.0000 mg | INTRAVENOUS | Status: DC | PRN

## 2020-06-14 MED ORDER — SODIUM CHLORIDE 0.9% FLUSH: 3.0000 mL | Freq: Two times a day (BID) | INTRAVENOUS | Status: DC

## 2020-06-14 MED ORDER — SODIUM CHLORIDE 0.9% FLUSH: 3.0000 mL | INTRAVENOUS | Status: DC | PRN

## 2020-06-14 MED ORDER — VERAPAMIL HCL 2.5 MG/ML IV SOLN
INTRAVENOUS | Status: DC | PRN
  Administered 2020-06-14: 2.5 mg via INTRA_ARTERIAL

## 2020-06-14 MED ORDER — FENTANYL CITRATE (PF) 100 MCG/2ML IJ SOLN
INTRAMUSCULAR | Status: DC | PRN
  Administered 2020-06-14: 50 ug via INTRAVENOUS

## 2020-06-14 MED ORDER — SODIUM CHLORIDE 0.9 % IV SOLN: 250.0000 mL | INTRAVENOUS | Status: DC | PRN

## 2020-06-14 MED ORDER — HYDRALAZINE HCL 20 MG/ML IJ SOLN: 10.0000 mg | INTRAMUSCULAR | Status: DC | PRN

## 2020-06-14 MED ORDER — ACETAMINOPHEN 325 MG PO TABS: 650.0000 mg | ORAL_TABLET | ORAL | Status: DC | PRN

## 2020-06-14 MED ORDER — ONDANSETRON HCL 4 MG/2ML IJ SOLN: 4.0000 mg | Freq: Four times a day (QID) | INTRAMUSCULAR | Status: DC | PRN

## 2020-06-14 MED ORDER — MIDAZOLAM HCL 2 MG/2ML IJ SOLN
INTRAMUSCULAR | Status: AC
  Filled 2020-06-14: qty 2

## 2020-06-14 MED ORDER — METOPROLOL TARTRATE 5 MG/5ML IV SOLN
INTRAVENOUS | Status: AC
  Filled 2020-06-14: qty 5

## 2020-06-14 MED ORDER — ASPIRIN 81 MG PO CHEW
81.0000 mg | CHEWABLE_TABLET | ORAL | Status: AC
  Administered 2020-06-14: 81 mg via ORAL

## 2020-06-14 MED ORDER — ASPIRIN 81 MG PO CHEW
CHEWABLE_TABLET | ORAL | Status: AC
  Filled 2020-06-14: qty 1

## 2020-06-14 MED ORDER — LIDOCAINE HCL (PF) 1 % IJ SOLN
INTRAMUSCULAR | Status: AC
  Filled 2020-06-14: qty 30

## 2020-06-14 MED ORDER — SODIUM BICARBONATE 8.4 % IV SOLN
INTRAVENOUS | Status: DC
  Filled 2020-06-14: qty 1000

## 2020-06-14 MED ORDER — METOPROLOL TARTRATE 5 MG/5ML IV SOLN
INTRAVENOUS | Status: DC | PRN
  Administered 2020-06-14: 5 mg via INTRAVENOUS

## 2020-06-14 MED ORDER — VERAPAMIL HCL 2.5 MG/ML IV SOLN
INTRAVENOUS | Status: AC
  Filled 2020-06-14: qty 2

## 2020-06-14 MED ORDER — SODIUM CHLORIDE 0.9 % IV SOLN: INTRAVENOUS | Status: DC

## 2020-06-14 MED ORDER — HEPARIN SODIUM (PORCINE) 1000 UNIT/ML IJ SOLN
INTRAMUSCULAR | Status: DC | PRN
Start: 2020-06-14 — End: 2020-06-14
  Administered 2020-06-14: 4500 [IU] via INTRAVENOUS

## 2020-06-14 MED ORDER — SODIUM BICARBONATE BOLUS VIA INFUSION
INTRAVENOUS | Status: AC
  Administered 2020-06-14: 250 meq via INTRAVENOUS
  Filled 2020-06-14: qty 1

## 2020-06-14 SURGICAL SUPPLY — 13 items
CATH INFINITI 5 FR JL3.5 (CATHETERS) ×2 IMPLANT
CATH INFINITI 5F PIG 125CM (CATHETERS) ×2 IMPLANT
CATH INFINITI JR4 5F (CATHETERS) ×2 IMPLANT
DEVICE RAD TR BAND REGULAR (VASCULAR PRODUCTS) ×2 IMPLANT
DRAPE BRACHIAL (DRAPES) ×2 IMPLANT
GLIDESHEATH SLEND SS 6F .021 (SHEATH) ×2 IMPLANT
KIT SYRINGE INJ CVI SPIKEX1 (MISCELLANEOUS) ×2 IMPLANT
PACK CARDIAC CATH (CUSTOM PROCEDURE TRAY) ×2 IMPLANT
PROTECTION STATION PRESSURIZED (MISCELLANEOUS) ×2
SET ATX SIMPLICITY (MISCELLANEOUS) ×2 IMPLANT
STATION PROTECTION PRESSURIZED (MISCELLANEOUS) ×1 IMPLANT
WIRE HITORQ VERSACORE ST 145CM (WIRE) ×2 IMPLANT
WIRE ROSEN-J .035X260CM (WIRE) ×2 IMPLANT

## 2020-06-21 ENCOUNTER — Encounter: Payer: Self-pay | Admitting: Cardiothoracic Surgery

## 2020-06-21 ENCOUNTER — Other Ambulatory Visit: Payer: Self-pay

## 2020-06-21 ENCOUNTER — Other Ambulatory Visit: Payer: Self-pay | Admitting: *Deleted

## 2020-06-21 ENCOUNTER — Institutional Professional Consult (permissible substitution): Payer: Medicare HMO | Admitting: Cardiothoracic Surgery

## 2020-06-21 DIAGNOSIS — I25119 Atherosclerotic heart disease of native coronary artery with unspecified angina pectoris: Secondary | ICD-10-CM | POA: Diagnosis not present

## 2020-06-21 DIAGNOSIS — I2510 Atherosclerotic heart disease of native coronary artery without angina pectoris: Secondary | ICD-10-CM | POA: Insufficient documentation

## 2020-06-21 NOTE — Progress Notes (Signed)
[address]       [place] [address]             [phone]     CARDIOTHORACIC SURGERY CONSULTATION REPORT  Referring Provider is Isaias Cowman, MD Primary Cardiologist is None PCP is Chrismon, Vickki Muff, PA-C  Chief Complaint  Patient presents with   Coronary Artery Disease    New patient consultation, CATH 06/14/20    HPI:  75 year old man is referred for CABG.  He was in his usual state of health with underlying hypertension and renal insufficiency when he began to experience chest pain on a couple of occasions with exertion this past spring.  He presented with the symptoms to his local physicians investigated with a perfusion stress echo which was positive for ischemia.  He underwent left heart catheterization demonstrating severe multivessel disease.  In the meantime his symptoms have been relatively stable on medical therapy.  He is not known to have suffered an MI.  He has preserved LV function.  Past Medical History:  Diagnosis Date   Arthritis    Cancer ([place])    SKIN   COVID-19    Diverticulitis    Edema    FEET/ANKLES   Hypertension    Liver disease     Past Surgical History:  Procedure Laterality Date   BACK SURGERY     CATARACT EXTRACTION W/PHACO Left 07/31/2018   Procedure: CATARACT EXTRACTION PHACO AND INTRAOCULAR LENS PLACEMENT ([place]);  Surgeon: Marchia Meiers, MD;  Location: [facility];  Service: Ophthalmology;  Laterality: Left;  [place] 00:37.4 CDE 5.06 Fluid Pack Lot # [id number]   CATARACT EXTRACTION W/PHACO Right 08/28/2018   Procedure: CATARACT EXTRACTION PHACO AND INTRAOCULAR LENS PLACEMENT (IOC);  Surgeon: Marchia Meiers, MD;  Location: [facility];  Service: Ophthalmology;  Laterality: Right;  [place] 00:49.3 CDE 6.95 Fluid Pack Lot # [id number] H   COLONOSCOPY WITH PROPOFOL N/A 09/20/2015   Procedure: COLONOSCOPY WITH PROPOFOL;  Surgeon: [name], MD;  Location: [facility];  Service: Endoscopy;  Laterality: N/A;   LEFT HEART CATH  AND CORONARY ANGIOGRAPHY N/A 06/14/2020   Procedure: LEFT HEART CATH AND CORONARY ANGIOGRAPHY;  Surgeon: Isaias Cowman, MD;  Location: [facility];  Service: Cardiovascular;  Laterality: N/A;   MICRODISCECTOMY LUMBAR  03/17/2013   VASECTOMY      Family History  Problem Relation Age of Onset   Heart disease Father    Pneumonia Mother    Cancer Maternal Uncle     Social History   Socioeconomic History   Marital status: Married    Spouse name: Not on file   Number of children: 2   Years of education: Not on file   Highest education level: 12th grade  Occupational History   Occupation: retired  Tobacco Use   Smoking status: Former    Pack years: 0.00    Types: Cigarettes   Smokeless tobacco: Never   Tobacco comments:    > 50 years ago  Media planner   Vaping Use: Never used  Substance and Sexual Activity   Alcohol use: Not Currently    Alcohol/week: 0.0 standard drinks   Drug use: No   Sexual activity: Not on file  Other Topics Concern   Not on file  Social History Narrative   Not on file   Social Determinants of Health   Financial Resource Strain: Low Risk    Difficulty of Paying Living Expenses: Not hard at all  Food Insecurity: No Food  Insecurity   Worried About Charity fundraiser in the Last Year: Never true   [place] in the Last Year: Never true  Transportation Needs: No Transportation Needs   Lack of Transportation (Medical): No   Lack of Transportation (Non-Medical): No  Physical Activity: Sufficiently Active   Days of Exercise per Week: 3 days   Minutes of Exercise per Session: 60 min  Stress: No Stress Concern Present   Feeling of Stress : Not at all  Social Connections: Socially Integrated   Frequency of Communication with Friends and Family: More than three times a week   Frequency of Social Gatherings with Friends and Family: More than three times a week   Attends Religious Services: More than 4 times per year   Active Member  of [employer] or Organizations: Yes   Attends Music therapist: More than 4 times per year   Marital Status: Married  Human resources officer Violence: Not At Risk   Fear of Current or Ex-Partner: No   Emotionally Abused: No   Physically Abused: No   Sexually Abused: No    Current Outpatient Medications  Medication Sig Dispense Refill   acetaminophen (TYLENOL) 500 MG tablet Take 500 mg by mouth every 6 (six) hours as needed for mild pain or moderate pain. As needed for mild pain.     amLODipine (NORVASC) 2.5 MG tablet Take 2.5 mg by mouth daily.     Ascorbic Acid (VITAMIN C) 1000 MG tablet Take 1,000 mg by mouth daily.     aspirin EC 81 MG tablet Take 81 mg by mouth daily.     Cholecalciferol (DIALYVITE VITAMIN D 5000) 125 MCG (5000 UT) capsule Take 5,000 Units by mouth daily.     irbesartan (AVAPRO) 300 MG tablet Take 300 mg by mouth daily.     isosorbide mononitrate (IMDUR) 30 MG 24 hr tablet Take 30 mg by mouth daily.     metoprolol succinate (TOPROL-XL) 25 MG 24 hr tablet Take 25 mg by mouth daily.     nitroGLYCERIN (NITROSTAT) 0.4 MG SL tablet Place 0.4 mg under the tongue every 2 (two) hours as needed for chest pain.     zinc gluconate 50 MG tablet Take 50 mg by mouth daily.     No current facility-administered medications for this visit.    No Known Allergies    Review of Systems:   General:  Small weight gain recently  Cardiac:  Denies orthopnea or cardiac dysrhythmias  Respiratory:  No cough or shortness of breath  GI:   Negative for pain or bleeding  GU:   History of renal insufficiency baseline creatinine 1.4  Vascular:  No claudication or DVTs  Neuro:   No history of stroke TIAs or seizures  Musculoskeletal: History of generalized arthritis  Skin:   Negative  Psych:   Negative  Eyes:   Wears glasses  ENT:   Mild hearing loss  Hematologic:  Negative  Endocrine:  Not diabetic     Physical Exam:   BP (!) 170/69 (BP Location: Left Arm, Patient Position:  Sitting, Cuff Size: Normal)   Pulse 61   Resp 20   Ht 5\' 7"  (1.702 m)   Wt 83 kg   SpO2 97% Comment: RA  BMI 28.66 kg/m   General:    well-appearing  HEENT:  Unremarkable   Neck:   no JVD, no bruits, no adenopathy   Chest:   clear to auscultation, symmetrical breath sounds, no wheezes, no  rhonchi   CV:   RRR, no detectable murmur   Abdomen:  soft, non-tender, no masses   Extremities:  warm, well-perfused, pulses intact throughout, no LE edema  Rectal/GU  Deferred  Neuro:   Grossly non-focal and symmetrical throughout  Skin:   Clean and dry, no rashes, no breakdown   Diagnostic Tests:  Personally reviewed his available imaging studies and agree with their interpretation including left heart catheterization which shows 100% right coronary artery occlusion and 90 to 95% occlusion of the LAD and ramus intermedius vessels.   Impression:  75 year old man with multivessel coronary artery disease.  Agree with suggestion for surgery at the best therapeutic option.   Plan:  Coronary artery bypass grafting on 07/05/2020. Routine preop work-up including ultrasonography and echocardiogram in the week leading up to surgery Report any chest pain at rest to this office or local emergency department   I spent in excess of 30 minutes during the conduct of this office consultation and >50% of this time involved direct face-to-face encounter with the patient for counseling and/or coordination of their care.          Level 3 Office Consult = 40 minutes         Level 4 Office Consult = 60 minutes         Level 5 Office Consult = 80 minutes  B.  Murvin Natal, MD 06/21/2020 4:45 PM

## 2020-06-22 ENCOUNTER — Other Ambulatory Visit: Payer: Self-pay | Admitting: *Deleted

## 2020-06-22 DIAGNOSIS — I25119 Atherosclerotic heart disease of native coronary artery with unspecified angina pectoris: Secondary | ICD-10-CM

## 2020-06-22 NOTE — Pre-Procedure Instructions (Signed)
Surgical Instructions:    Your procedure is scheduled on Friday, June 24th (07:30 AM- 12:35 PM).  Report to [facility] Main Entrance "A" at 05:30 A.M., then check in with the Admitting office.  Call this number if you have any questions prior to your surgery date, or have problems the morning of surgery:  [phone]    Remember:  Do not eat or drink after midnight the night before your surgery.     Take these medicines the morning of surgery with A SIP OF WATER:  amLODipine (NORVASC) isosorbide mononitrate (IMDUR)  metoprolol succinate (TOPROL-XL)  IF NEEDED: acetaminophen (TYLENOL) nitroGLYCERIN (NITROSTAT)   *Follow your surgeon's instructions concerning Aspirin.  If no instructions were given by your surgeon then you will need to call the office to get those instructions.     As of today, STOP taking any Aleve, Naproxen, Ibuprofen, Motrin, Advil, Goody's, BC's, all herbal medications, fish oil, and all vitamins.             Special instructions:    [place]- Preparing For Surgery  Before surgery, you can play an important role. Because skin is not sterile, your skin needs to be as free of germs as possible. You can reduce the number of germs on your skin by washing with CHG (chlorahexidine gluconate) Soap before surgery.  CHG is an antiseptic cleaner which kills germs and bonds with the skin to continue killing germs even after washing.     Please do not use if you have an allergy to CHG or antibacterial soaps. If your skin becomes reddened/irritated stop using the CHG.  Do not shave (including legs and underarms) for at least 48 hours prior to first CHG shower. It is OK to shave your face.  Please follow these instructions carefully.     Shower the NIGHT BEFORE SURGERY and the MORNING OF SURGERY with CHG Soap.   If you chose to wash your hair, wash your hair first as usual with your normal shampoo. After you shampoo, rinse your hair and body thoroughly to remove  the shampoo.  Then [employer] and genitals (private parts) with your normal soap and rinse thoroughly to remove soap.  After that Use CHG Soap as you would any other liquid soap. You can apply CHG directly to the skin and wash gently with a scrungie or a clean washcloth.   Apply the CHG Soap to your body ONLY FROM THE NECK DOWN.  Do not use on open wounds or open sores. Avoid contact with your eyes, ears, mouth and genitals (private parts). Wash Face and genitals (private parts)  with your normal soap.   Wash thoroughly, paying special attention to the area where your surgery will be performed.  Thoroughly rinse your body with warm water from the neck down.  DO NOT shower/wash with your normal soap after using and rinsing off the CHG Soap.  Pat yourself dry with a CLEAN TOWEL.  Wear CLEAN PAJAMAS to bed the night before surgery  Place CLEAN SHEETS on your bed the night before your surgery  DO NOT SLEEP WITH PETS.   Day of Surgery:  Take a shower with CHG soap. Wear Clean/Comfortable clothing the morning of surgery Do not wear lotions, powders, perfumes/colognes, or deodorant.   Remember to brush your teeth WITH YOUR REGULAR TOOTHPASTE. Do not wear jewelry. Men may shave face and neck. Do not bring valuables to the hospital. [facility] is not responsible for any belongings or valuables.  Do NOT  Smoke (Tobacco/Vaping) or drink Alcohol 24 hours prior to your procedure.  If you use a CPAP at night, you may bring all equipment for your overnight stay.   Contacts, glasses, dentures or bridgework may not be worn into surgery, please bring cases for these belongings.   For patients admitted to the hospital, discharge time will be determined by your treatment team.  Patients discharged the day of surgery will not be allowed to drive home, and someone needs to stay with them for 24 hours.  ONLY 1 SUPPORT PERSON MAY BE PRESENT WHILE YOU ARE IN SURGERY. IF YOU ARE TO BE ADMITTED ONCE YOU  ARE IN YOUR ROOM YOU WILL BE ALLOWED TWO (2) VISITORS.  Minor children may have two parents present. Special consideration for safety and communication needs will be reviewed on a case by case basis.     Please read over the following fact sheets that you were given.

## 2020-06-23 ENCOUNTER — Encounter (HOSPITAL_COMMUNITY): Payer: Self-pay

## 2020-06-23 ENCOUNTER — Other Ambulatory Visit: Payer: Self-pay

## 2020-06-23 ENCOUNTER — Encounter (HOSPITAL_COMMUNITY)
Admission: RE | Admit: 2020-06-23 | Discharge: 2020-06-23 | Disposition: A | Discharge status: Home / Self Care | Payer: Medicare HMO | Source: Ambulatory Visit | Attending: Cardiothoracic Surgery | Admitting: Cardiothoracic Surgery

## 2020-06-23 ENCOUNTER — Ambulatory Visit (HOSPITAL_BASED_OUTPATIENT_CLINIC_OR_DEPARTMENT_OTHER)
Admission: RE | Admit: 2020-06-23 | Discharge: 2020-06-23 | Disposition: A | Discharge status: Home / Self Care | Payer: Medicare HMO | Source: Ambulatory Visit | Attending: Cardiothoracic Surgery | Admitting: Cardiothoracic Surgery

## 2020-06-23 ENCOUNTER — Ambulatory Visit (HOSPITAL_COMMUNITY)
Admission: RE | Admit: 2020-06-23 | Discharge: 2020-06-23 | Disposition: A | Discharge status: Home / Self Care | Payer: Medicare HMO | Source: Ambulatory Visit | Attending: Cardiothoracic Surgery | Admitting: Cardiothoracic Surgery

## 2020-06-23 DIAGNOSIS — Z01818 Encounter for other preprocedural examination: Secondary | ICD-10-CM | POA: Insufficient documentation

## 2020-06-23 DIAGNOSIS — D6959 Other secondary thrombocytopenia: Secondary | ICD-10-CM | POA: Diagnosis not present

## 2020-06-23 DIAGNOSIS — I25119 Atherosclerotic heart disease of native coronary artery with unspecified angina pectoris: Secondary | ICD-10-CM

## 2020-06-23 DIAGNOSIS — D696 Thrombocytopenia, unspecified: Secondary | ICD-10-CM | POA: Diagnosis not present

## 2020-06-23 DIAGNOSIS — Z8616 Personal history of COVID-19: Secondary | ICD-10-CM | POA: Diagnosis not present

## 2020-06-23 DIAGNOSIS — M1990 Unspecified osteoarthritis, unspecified site: Secondary | ICD-10-CM | POA: Diagnosis not present

## 2020-06-23 DIAGNOSIS — K769 Liver disease, unspecified: Secondary | ICD-10-CM | POA: Diagnosis not present

## 2020-06-23 DIAGNOSIS — Z20822 Contact with and (suspected) exposure to covid-19: Secondary | ICD-10-CM | POA: Insufficient documentation

## 2020-06-23 DIAGNOSIS — I2510 Atherosclerotic heart disease of native coronary artery without angina pectoris: Secondary | ICD-10-CM | POA: Diagnosis not present

## 2020-06-23 DIAGNOSIS — I10 Essential (primary) hypertension: Secondary | ICD-10-CM | POA: Insufficient documentation

## 2020-06-23 DIAGNOSIS — N1832 Chronic kidney disease, stage 3b: Secondary | ICD-10-CM | POA: Diagnosis not present

## 2020-06-23 DIAGNOSIS — I129 Hypertensive chronic kidney disease with stage 1 through stage 4 chronic kidney disease, or unspecified chronic kidney disease: Secondary | ICD-10-CM | POA: Diagnosis not present

## 2020-06-23 DIAGNOSIS — D62 Acute posthemorrhagic anemia: Secondary | ICD-10-CM | POA: Diagnosis not present

## 2020-06-23 LAB — COMPREHENSIVE METABOLIC PANEL
ALT: 15 U/L (ref 0–44)
AST: 18 U/L (ref 15–41)
Albumin: 3.3 g/dL — ABNORMAL LOW (ref 3.5–5.0)
Alkaline Phosphatase: 72 U/L (ref 38–126)
Anion gap: 10 (ref 5–15)
BUN: 38 mg/dL — ABNORMAL HIGH (ref 8–23)
CO2: 20 mmol/L — ABNORMAL LOW (ref 22–32)
Calcium: 8.6 mg/dL — ABNORMAL LOW (ref 8.9–10.3)
Chloride: 109 mmol/L (ref 98–111)
Creatinine, Ser: 2 mg/dL — ABNORMAL HIGH (ref 0.61–1.24)
GFR, Estimated: 34 mL/min — ABNORMAL LOW (ref >60)
Glucose, Bld: 90 mg/dL (ref 70–99)
Potassium: 4.8 mmol/L (ref 3.5–5.1)
Sodium: 139 mmol/L (ref 135–145)
Total Bilirubin: 0.8 mg/dL (ref 0.3–1.2)
Total Protein: 6.4 g/dL — ABNORMAL LOW (ref 6.5–8.1)

## 2020-06-23 LAB — CBC
HCT: 40.2 % (ref 39.0–52.0)
Hemoglobin: 13.6 g/dL (ref 13.0–17.0)
MCH: 30.8 pg (ref 26.0–34.0)
MCHC: 33.8 g/dL (ref 30.0–36.0)
MCV: 91.2 fL (ref 80.0–100.0)
Platelets: 210 10*3/uL (ref 150–400)
RBC: 4.41 MIL/uL (ref 4.22–5.81)
RDW: 12.2 % (ref 11.5–15.5)
WBC: 9.3 10*3/uL (ref 4.0–10.5)
nRBC: 0 % (ref 0.0–0.2)

## 2020-06-23 LAB — BLOOD GAS, ARTERIAL
Acid-base deficit: 3 mmol/L — ABNORMAL HIGH (ref 0.0–2.0)
Bicarbonate: 21.3 mmol/L (ref 20.0–28.0)
Drawn by: 602861
FIO2: 21
O2 Saturation: 98.2 %
Patient temperature: 37
pCO2 arterial: 36.4 mmHg (ref 32.0–48.0)
pH, Arterial: 7.384 (ref 7.350–7.450)
pO2, Arterial: 118 mmHg — ABNORMAL HIGH (ref 83.0–108.0)

## 2020-06-23 LAB — PROTIME-INR
INR: 1 (ref 0.8–1.2)
Prothrombin Time: 12.9 seconds (ref 11.4–15.2)

## 2020-06-23 LAB — SARS CORONAVIRUS 2 (TAT 6-24 HRS): SARS Coronavirus 2: NEGATIVE

## 2020-06-23 LAB — URINALYSIS, ROUTINE W REFLEX MICROSCOPIC
Bacteria, UA: NONE SEEN
Bilirubin Urine: NEGATIVE
Glucose, UA: NEGATIVE mg/dL
Hgb urine dipstick: NEGATIVE
Ketones, ur: NEGATIVE mg/dL
Leukocytes,Ua: NEGATIVE
Nitrite: NEGATIVE
Protein, ur: 100 mg/dL — AB
Specific Gravity, Urine: 1.016 (ref 1.005–1.030)
pH: 5 (ref 5.0–8.0)

## 2020-06-23 LAB — SURGICAL PCR SCREEN
MRSA, PCR: NEGATIVE
Staphylococcus aureus: NEGATIVE

## 2020-06-23 LAB — HEMOGLOBIN A1C
Hgb A1c MFr Bld: 5 % (ref 4.8–5.6)
Mean Plasma Glucose: 96.8 mg/dL

## 2020-06-23 LAB — APTT: aPTT: 26 seconds (ref 24–36)

## 2020-06-23 MED ORDER — TRANEXAMIC ACID (OHS) BOLUS VIA INFUSION
15.0000 mg/kg | INTRAVENOUS | Status: AC
  Administered 2020-06-24: 1245 mg via INTRAVENOUS
  Filled 2020-06-23: qty 1245

## 2020-06-23 MED ORDER — VANCOMYCIN HCL 1500 MG/300ML IV SOLN
1500.0000 mg | Freq: Once | INTRAVENOUS | Status: AC
  Administered 2020-06-24: 1500 mg via INTRAVENOUS
  Filled 2020-06-23 (×2): qty 300

## 2020-06-23 MED ORDER — TRANEXAMIC ACID (OHS) PUMP PRIME SOLUTION
2.0000 mg/kg | INTRAVENOUS | Status: DC
  Filled 2020-06-23: qty 1.66

## 2020-06-23 MED ORDER — EPINEPHRINE HCL 5 MG/250ML IV SOLN IN NS
0.0000 ug/min | INTRAVENOUS | Status: DC
  Filled 2020-06-23: qty 250

## 2020-06-23 MED ORDER — DEXMEDETOMIDINE HCL IN NACL 400 MCG/100ML IV SOLN
0.1000 ug/kg/h | INTRAVENOUS | Status: AC
  Administered 2020-06-24: .7 ug/kg/h via INTRAVENOUS
  Filled 2020-06-23: qty 100

## 2020-06-23 MED ORDER — MILRINONE LACTATE IN DEXTROSE 20-5 MG/100ML-% IV SOLN
0.3000 ug/kg/min | INTRAVENOUS | Status: DC
  Filled 2020-06-23: qty 100

## 2020-06-23 MED ORDER — INSULIN REGULAR(HUMAN) IN NACL 100-0.9 UT/100ML-% IV SOLN
INTRAVENOUS | Status: AC
  Administered 2020-06-24: .6 [IU]/h via INTRAVENOUS
  Filled 2020-06-23: qty 100

## 2020-06-23 MED ORDER — PLASMA-LYTE A IV SOLN
INTRAVENOUS | Status: DC
  Filled 2020-06-23: qty 5

## 2020-06-23 MED ORDER — POTASSIUM CHLORIDE 2 MEQ/ML IV SOLN
80.0000 meq | INTRAVENOUS | Status: DC
  Filled 2020-06-23: qty 40

## 2020-06-23 MED ORDER — PHENYLEPHRINE HCL-NACL 20-0.9 MG/250ML-% IV SOLN
30.0000 ug/min | INTRAVENOUS | Status: AC
  Administered 2020-06-24: 20 ug/min via INTRAVENOUS
  Filled 2020-06-23: qty 250

## 2020-06-23 MED ORDER — CEFAZOLIN SODIUM-DEXTROSE 2-4 GM/100ML-% IV SOLN
2.0000 g | INTRAVENOUS | Status: AC
  Administered 2020-06-24: 2 g via INTRAVENOUS
  Filled 2020-06-23: qty 100

## 2020-06-23 MED ORDER — MAGNESIUM SULFATE 50 % IJ SOLN
40.0000 meq | INTRAMUSCULAR | Status: DC
  Filled 2020-06-23: qty 9.85

## 2020-06-23 MED ORDER — TRANEXAMIC ACID 1000 MG/10ML IV SOLN
1.5000 mg/kg/h | INTRAVENOUS | Status: AC
  Administered 2020-06-24: 1.5 mg/kg/h via INTRAVENOUS
  Filled 2020-06-23: qty 25

## 2020-06-23 MED ORDER — SODIUM CHLORIDE 0.9 % IV SOLN
INTRAVENOUS | Status: DC
  Filled 2020-06-23: qty 30

## 2020-06-23 MED ORDER — NOREPINEPHRINE 4 MG/250ML-% IV SOLN
0.0000 ug/min | INTRAVENOUS | Status: DC
  Filled 2020-06-23: qty 250

## 2020-06-23 MED ORDER — NITROGLYCERIN IN D5W 200-5 MCG/ML-% IV SOLN
2.0000 ug/min | INTRAVENOUS | Status: AC
  Administered 2020-06-24: 16.6 ug/min via INTRAVENOUS
  Filled 2020-06-23: qty 250

## 2020-06-23 MED ORDER — VANCOMYCIN HCL 1250 MG/250ML IV SOLN
1250.0000 mg | INTRAVENOUS | Status: DC
  Filled 2020-06-23: qty 250

## 2020-06-23 NOTE — Progress Notes (Signed)
Pre-CABG testing has been completed. Preliminary results can be found in CV Proc through chart review.   06/23/20 1:52 PM Kenneth Collins RVT

## 2020-06-23 NOTE — Anesthesia Preprocedure Evaluation (Addendum)
Anesthesia Evaluation  Patient identified by MRN, date of birth, ID band Patient awake    Reviewed: Allergy & Precautions, NPO status , Patient's Chart, lab work & pertinent test results  Airway Mallampati: II  TM Distance: >3 FB Neck ROM: Full    Dental  (+) Teeth Intact   Pulmonary neg pulmonary ROS, former smoker,    Pulmonary exam normal        Cardiovascular hypertension, Pt. on medications and Pt. on home beta blockers + CAD   Rhythm:Regular Rate:Normal     Neuro/Psych negative neurological ROS  negative psych ROS   GI/Hepatic Neg liver ROS, PUD,   Endo/Other  negative endocrine ROS  Renal/GU Renal disease  negative genitourinary   Musculoskeletal  (+) Arthritis , Osteoarthritis,    Abdominal (+)  Abdomen: soft. Bowel sounds: normal.  Peds  Hematology negative hematology ROS (+)   Anesthesia Other Findings   Reproductive/Obstetrics                            Anesthesia Physical Anesthesia Plan  ASA: 4  Anesthesia Plan: General   Post-op Pain Management:    Induction: Intravenous  PONV Risk Score and Plan: 2 and Ondansetron, Dexamethasone, Midazolam and Treatment may vary due to age or medical condition  Airway Management Planned: Mask and Oral ETT  Additional Equipment: Arterial line, CVP, PA Cath, TEE, 3D TEE and Ultrasound Guidance Line Placement  Intra-op Plan:   Post-operative Plan: Post-operative intubation/ventilation  Informed Consent: I have reviewed the patients History and Physical, chart, labs and discussed the procedure including the risks, benefits and alternatives for the proposed anesthesia with the patient or authorized representative who has indicated his/her understanding and acceptance.     Dental advisory given  Plan Discussed with: CRNA  Anesthesia Plan Comments: (Lab Results      Component                Value               Date                       WBC                      9.3                 06/23/2020                HGB                      13.6                06/23/2020                HCT                      40.2                06/23/2020                MCV                      91.2                06/23/2020                PLT  210                 06/23/2020           Lab Results      Component                Value               Date                      NA                       139                 06/23/2020                K                        4.8                 06/23/2020                CO2                      20 (L)              06/23/2020                GLUCOSE                  90                  06/23/2020                BUN                      38 (H)              06/23/2020                CREATININE               2.00 (H)            06/23/2020                CALCIUM                  8.6 (L)             06/23/2020                GFRNONAA                 34 (L)              06/23/2020                GFRAA                    36 (L)              05/12/2019           Cardiac Cath 06/22: ? Mid RCA lesion is 100% stenosed. ? Ost RCA to Prox RCA lesion is 90% stenosed. ? Prox LAD lesion is 95% stenosed. ? Ramus lesion is 90% stenosed. 1.  Three-vessel coronary artery disease with 95% stenosis ostial LAD, diffuse 90% stenosis large caliber ramus intermedius branch, chronically occluded mid  RCA with ipsi and contra collaterals 2.  Mildly reduced left ventricular function estimated LV ejection fraction 45 to 50% with focal inferior wall akinesis )       Anesthesia Quick Evaluation

## 2020-06-23 NOTE — Progress Notes (Signed)
PCP - Dr Natale Milch Cardiologist -  Dr Judith Blonder   Chest x-ray - 06/23/20 EKG - 06/23/17 Stress Test - 05/04/20 ECHO - 05/04/20  Aspirin Instructions: follow surgeon's instructions.   COVID TEST-  06/23/20   Anesthesia review: Yes, direct messaged to [place] aware.  Patient denies shortness of breath, fever, cough and chest pain at PAT appointment   All instructions explained to the patient, with a verbal understanding of the material. Patient agrees to go over the instructions while at home for a better understanding. Patient also instructed to self quarantine after being tested for COVID-19. The opportunity to ask questions was provided.

## 2020-06-24 ENCOUNTER — Inpatient Hospital Stay (HOSPITAL_COMMUNITY): Payer: Medicare HMO | Admitting: Physician Assistant

## 2020-06-24 ENCOUNTER — Inpatient Hospital Stay (HOSPITAL_COMMUNITY): Payer: Medicare HMO

## 2020-06-24 ENCOUNTER — Inpatient Hospital Stay (HOSPITAL_COMMUNITY)
Admission: RE | Disposition: A | Discharge status: Home / Self Care | Payer: Self-pay | Source: Home / Self Care | Attending: Cardiothoracic Surgery

## 2020-06-24 ENCOUNTER — Inpatient Hospital Stay (HOSPITAL_COMMUNITY)
Admission: RE | Admit: 2020-06-24 | Discharge: 2020-06-29 | DRG: 236 | Disposition: A | Discharge status: Home / Self Care | Payer: Medicare HMO | Attending: Cardiothoracic Surgery | Admitting: Cardiothoracic Surgery

## 2020-06-24 ENCOUNTER — Encounter (HOSPITAL_COMMUNITY): Payer: Self-pay | Admitting: Cardiothoracic Surgery

## 2020-06-24 DIAGNOSIS — D696 Thrombocytopenia, unspecified: Secondary | ICD-10-CM | POA: Diagnosis not present

## 2020-06-24 DIAGNOSIS — N1832 Chronic kidney disease, stage 3b: Secondary | ICD-10-CM | POA: Diagnosis not present

## 2020-06-24 DIAGNOSIS — I129 Hypertensive chronic kidney disease with stage 1 through stage 4 chronic kidney disease, or unspecified chronic kidney disease: Secondary | ICD-10-CM | POA: Diagnosis present

## 2020-06-24 DIAGNOSIS — N183 Chronic kidney disease, stage 3 unspecified: Secondary | ICD-10-CM | POA: Diagnosis not present

## 2020-06-24 DIAGNOSIS — Z8616 Personal history of COVID-19: Secondary | ICD-10-CM

## 2020-06-24 DIAGNOSIS — K769 Liver disease, unspecified: Secondary | ICD-10-CM | POA: Diagnosis present

## 2020-06-24 DIAGNOSIS — R918 Other nonspecific abnormal finding of lung field: Secondary | ICD-10-CM | POA: Diagnosis not present

## 2020-06-24 DIAGNOSIS — I2510 Atherosclerotic heart disease of native coronary artery without angina pectoris: Secondary | ICD-10-CM | POA: Diagnosis not present

## 2020-06-24 DIAGNOSIS — Z09 Encounter for follow-up examination after completed treatment for conditions other than malignant neoplasm: Secondary | ICD-10-CM

## 2020-06-24 DIAGNOSIS — Z8249 Family history of ischemic heart disease and other diseases of the circulatory system: Secondary | ICD-10-CM | POA: Diagnosis not present

## 2020-06-24 DIAGNOSIS — M1990 Unspecified osteoarthritis, unspecified site: Secondary | ICD-10-CM | POA: Diagnosis not present

## 2020-06-24 DIAGNOSIS — I493 Ventricular premature depolarization: Secondary | ICD-10-CM | POA: Diagnosis present

## 2020-06-24 DIAGNOSIS — I088 Other rheumatic multiple valve diseases: Secondary | ICD-10-CM | POA: Diagnosis not present

## 2020-06-24 DIAGNOSIS — Z87891 Personal history of nicotine dependence: Secondary | ICD-10-CM | POA: Diagnosis not present

## 2020-06-24 DIAGNOSIS — I25119 Atherosclerotic heart disease of native coronary artery with unspecified angina pectoris: Secondary | ICD-10-CM

## 2020-06-24 DIAGNOSIS — Z4682 Encounter for fitting and adjustment of non-vascular catheter: Secondary | ICD-10-CM | POA: Diagnosis not present

## 2020-06-24 DIAGNOSIS — M79644 Pain in right finger(s): Secondary | ICD-10-CM | POA: Diagnosis not present

## 2020-06-24 DIAGNOSIS — Z951 Presence of aortocoronary bypass graft: Secondary | ICD-10-CM | POA: Diagnosis not present

## 2020-06-24 DIAGNOSIS — I2584 Coronary atherosclerosis due to calcified coronary lesion: Secondary | ICD-10-CM | POA: Diagnosis present

## 2020-06-24 DIAGNOSIS — Z20822 Contact with and (suspected) exposure to covid-19: Secondary | ICD-10-CM | POA: Diagnosis present

## 2020-06-24 DIAGNOSIS — D6959 Other secondary thrombocytopenia: Secondary | ICD-10-CM | POA: Diagnosis not present

## 2020-06-24 DIAGNOSIS — Z9889 Other specified postprocedural states: Secondary | ICD-10-CM | POA: Diagnosis not present

## 2020-06-24 DIAGNOSIS — J90 Pleural effusion, not elsewhere classified: Secondary | ICD-10-CM | POA: Diagnosis not present

## 2020-06-24 DIAGNOSIS — E8770 Fluid overload, unspecified: Secondary | ICD-10-CM | POA: Diagnosis not present

## 2020-06-24 DIAGNOSIS — J9811 Atelectasis: Secondary | ICD-10-CM | POA: Diagnosis not present

## 2020-06-24 DIAGNOSIS — D62 Acute posthemorrhagic anemia: Secondary | ICD-10-CM | POA: Diagnosis not present

## 2020-06-24 HISTORY — PX: TEE WITHOUT CARDIOVERSION: SHX5443

## 2020-06-24 HISTORY — PX: ENDOVEIN HARVEST OF GREATER SAPHENOUS VEIN: SHX5059

## 2020-06-24 HISTORY — PX: CORONARY ARTERY BYPASS GRAFT: SHX141

## 2020-06-24 HISTORY — PX: RADIAL ARTERY HARVEST: SHX5067

## 2020-06-24 LAB — BASIC METABOLIC PANEL
Anion gap: 7 (ref 5–15)
BUN: 31 mg/dL — ABNORMAL HIGH (ref 8–23)
CO2: 20 mmol/L — ABNORMAL LOW (ref 22–32)
Calcium: 7.3 mg/dL — ABNORMAL LOW (ref 8.9–10.3)
Chloride: 111 mmol/L (ref 98–111)
Creatinine, Ser: 1.91 mg/dL — ABNORMAL HIGH (ref 0.61–1.24)
GFR, Estimated: 36 mL/min — ABNORMAL LOW (ref >60)
Glucose, Bld: 179 mg/dL — ABNORMAL HIGH (ref 70–99)
Potassium: 4.8 mmol/L (ref 3.5–5.1)
Sodium: 138 mmol/L (ref 135–145)

## 2020-06-24 LAB — POCT I-STAT 7, (LYTES, BLD GAS, ICA,H+H)
Acid-Base Excess: 0 mmol/L (ref 0.0–2.0)
Acid-base deficit: 2 mmol/L (ref 0.0–2.0)
Acid-base deficit: 1 mmol/L (ref 0.0–2.0)
Acid-base deficit: 3 mmol/L — ABNORMAL HIGH (ref 0.0–2.0)
Acid-base deficit: 3 mmol/L — ABNORMAL HIGH (ref 0.0–2.0)
Acid-base deficit: 2 mmol/L (ref 0.0–2.0)
Acid-base deficit: 5 mmol/L — ABNORMAL HIGH (ref 0.0–2.0)
Bicarbonate: 23.6 mmol/L (ref 20.0–28.0)
Bicarbonate: 23.4 mmol/L (ref 20.0–28.0)
Bicarbonate: 25.2 mmol/L (ref 20.0–28.0)
Bicarbonate: 25.7 mmol/L (ref 20.0–28.0)
Bicarbonate: 22 mmol/L (ref 20.0–28.0)
Bicarbonate: 24 mmol/L (ref 20.0–28.0)
Bicarbonate: 18.3 mmol/L — ABNORMAL LOW (ref 20.0–28.0)
Calcium, Ion: 1.17 mmol/L (ref 1.15–1.40)
Calcium, Ion: 0.95 mmol/L — ABNORMAL LOW (ref 1.15–1.40)
Calcium, Ion: 1.01 mmol/L — ABNORMAL LOW (ref 1.15–1.40)
Calcium, Ion: 1.02 mmol/L — ABNORMAL LOW (ref 1.15–1.40)
Calcium, Ion: 1.06 mmol/L — ABNORMAL LOW (ref 1.15–1.40)
Calcium, Ion: 1.1 mmol/L — ABNORMAL LOW (ref 1.15–1.40)
Calcium, Ion: 1.06 mmol/L — ABNORMAL LOW (ref 1.15–1.40)
HCT: 33 % — ABNORMAL LOW (ref 39.0–52.0)
HCT: 23 % — ABNORMAL LOW (ref 39.0–52.0)
HCT: 23 % — ABNORMAL LOW (ref 39.0–52.0)
HCT: 25 % — ABNORMAL LOW (ref 39.0–52.0)
HCT: 23 % — ABNORMAL LOW (ref 39.0–52.0)
HCT: 31 % — ABNORMAL LOW (ref 39.0–52.0)
HCT: 27 % — ABNORMAL LOW (ref 39.0–52.0)
Hemoglobin: 11.2 g/dL — ABNORMAL LOW (ref 13.0–17.0)
Hemoglobin: 7.8 g/dL — ABNORMAL LOW (ref 13.0–17.0)
Hemoglobin: 7.8 g/dL — ABNORMAL LOW (ref 13.0–17.0)
Hemoglobin: 8.5 g/dL — ABNORMAL LOW (ref 13.0–17.0)
Hemoglobin: 7.8 g/dL — ABNORMAL LOW (ref 13.0–17.0)
Hemoglobin: 10.5 g/dL — ABNORMAL LOW (ref 13.0–17.0)
Hemoglobin: 9.2 g/dL — ABNORMAL LOW (ref 13.0–17.0)
O2 Saturation: 100 %
O2 Saturation: 100 %
O2 Saturation: 100 %
O2 Saturation: 86 %
O2 Saturation: 100 %
O2 Saturation: 99 %
O2 Saturation: 100 %
Patient temperature: 37
Patient temperature: 37
Patient temperature: 37
Patient temperature: 37
Patient temperature: 34.7
Patient temperature: 36.7
Potassium: 4.4 mmol/L (ref 3.5–5.1)
Potassium: 5 mmol/L (ref 3.5–5.1)
Potassium: 6.3 mmol/L (ref 3.5–5.1)
Potassium: 6.4 mmol/L (ref 3.5–5.1)
Potassium: 6.7 mmol/L (ref 3.5–5.1)
Potassium: 5.8 mmol/L — ABNORMAL HIGH (ref 3.5–5.1)
Potassium: 4.1 mmol/L (ref 3.5–5.1)
Sodium: 141 mmol/L (ref 135–145)
Sodium: 139 mmol/L (ref 135–145)
Sodium: 139 mmol/L (ref 135–145)
Sodium: 142 mmol/L (ref 135–145)
Sodium: 139 mmol/L (ref 135–145)
Sodium: 139 mmol/L (ref 135–145)
Sodium: 141 mmol/L (ref 135–145)
TCO2: 25 mmol/L (ref 22–32)
TCO2: 25 mmol/L (ref 22–32)
TCO2: 27 mmol/L (ref 22–32)
TCO2: 27 mmol/L (ref 22–32)
TCO2: 23 mmol/L (ref 22–32)
TCO2: 25 mmol/L (ref 22–32)
TCO2: 19 mmol/L — ABNORMAL LOW (ref 22–32)
pCO2 arterial: 44.5 mmHg (ref 32.0–48.0)
pCO2 arterial: 47.1 mmHg (ref 32.0–48.0)
pCO2 arterial: 47.4 mmHg (ref 32.0–48.0)
pCO2 arterial: 48.3 mmHg — ABNORMAL HIGH (ref 32.0–48.0)
pCO2 arterial: 38.6 mmHg (ref 32.0–48.0)
pCO2 arterial: 41.4 mmHg (ref 32.0–48.0)
pCO2 arterial: 28 mmHg — ABNORMAL LOW (ref 32.0–48.0)
pH, Arterial: 7.331 — ABNORMAL LOW (ref 7.350–7.450)
pH, Arterial: 7.304 — ABNORMAL LOW (ref 7.350–7.450)
pH, Arterial: 7.333 — ABNORMAL LOW (ref 7.350–7.450)
pH, Arterial: 7.333 — ABNORMAL LOW (ref 7.350–7.450)
pH, Arterial: 7.363 (ref 7.350–7.450)
pH, Arterial: 7.36 (ref 7.350–7.450)
pH, Arterial: 7.422 (ref 7.350–7.450)
pO2, Arterial: 417 mmHg — ABNORMAL HIGH (ref 83.0–108.0)
pO2, Arterial: 365 mmHg — ABNORMAL HIGH (ref 83.0–108.0)
pO2, Arterial: 468 mmHg — ABNORMAL HIGH (ref 83.0–108.0)
pO2, Arterial: 57 mmHg — ABNORMAL LOW (ref 83.0–108.0)
pO2, Arterial: 329 mmHg — ABNORMAL HIGH (ref 83.0–108.0)
pO2, Arterial: 164 mmHg — ABNORMAL HIGH (ref 83.0–108.0)
pO2, Arterial: 173 mmHg — ABNORMAL HIGH (ref 83.0–108.0)

## 2020-06-24 LAB — MAGNESIUM: Magnesium: 3.1 mg/dL — ABNORMAL HIGH (ref 1.7–2.4)

## 2020-06-24 LAB — GLUCOSE, CAPILLARY
Glucose-Capillary: 142 mg/dL — ABNORMAL HIGH (ref 70–99)
Glucose-Capillary: 131 mg/dL — ABNORMAL HIGH (ref 70–99)
Glucose-Capillary: 136 mg/dL — ABNORMAL HIGH (ref 70–99)
Glucose-Capillary: 159 mg/dL — ABNORMAL HIGH (ref 70–99)
Glucose-Capillary: 163 mg/dL — ABNORMAL HIGH (ref 70–99)
Glucose-Capillary: 91 mg/dL (ref 70–99)
Glucose-Capillary: 173 mg/dL — ABNORMAL HIGH (ref 70–99)
Glucose-Capillary: 177 mg/dL — ABNORMAL HIGH (ref 70–99)
Glucose-Capillary: 202 mg/dL — ABNORMAL HIGH (ref 70–99)
Glucose-Capillary: 202 mg/dL — ABNORMAL HIGH (ref 70–99)

## 2020-06-24 LAB — POCT I-STAT, CHEM 8
BUN: 32 mg/dL — ABNORMAL HIGH (ref 8–23)
BUN: 32 mg/dL — ABNORMAL HIGH (ref 8–23)
BUN: 29 mg/dL — ABNORMAL HIGH (ref 8–23)
BUN: 33 mg/dL — ABNORMAL HIGH (ref 8–23)
BUN: 34 mg/dL — ABNORMAL HIGH (ref 8–23)
Calcium, Ion: 1.22 mmol/L (ref 1.15–1.40)
Calcium, Ion: 1.13 mmol/L — ABNORMAL LOW (ref 1.15–1.40)
Calcium, Ion: 0.96 mmol/L — ABNORMAL LOW (ref 1.15–1.40)
Calcium, Ion: 1.03 mmol/L — ABNORMAL LOW (ref 1.15–1.40)
Calcium, Ion: 1.04 mmol/L — ABNORMAL LOW (ref 1.15–1.40)
Chloride: 108 mmol/L (ref 98–111)
Chloride: 108 mmol/L (ref 98–111)
Chloride: 105 mmol/L (ref 98–111)
Chloride: 107 mmol/L (ref 98–111)
Chloride: 109 mmol/L (ref 98–111)
Creatinine, Ser: 1.8 mg/dL — ABNORMAL HIGH (ref 0.61–1.24)
Creatinine, Ser: 1.8 mg/dL — ABNORMAL HIGH (ref 0.61–1.24)
Creatinine, Ser: 1.6 mg/dL — ABNORMAL HIGH (ref 0.61–1.24)
Creatinine, Ser: 1.7 mg/dL — ABNORMAL HIGH (ref 0.61–1.24)
Creatinine, Ser: 1.7 mg/dL — ABNORMAL HIGH (ref 0.61–1.24)
Glucose, Bld: 100 mg/dL — ABNORMAL HIGH (ref 70–99)
Glucose, Bld: 111 mg/dL — ABNORMAL HIGH (ref 70–99)
Glucose, Bld: 96 mg/dL (ref 70–99)
Glucose, Bld: 127 mg/dL — ABNORMAL HIGH (ref 70–99)
Glucose, Bld: 128 mg/dL — ABNORMAL HIGH (ref 70–99)
HCT: 33 % — ABNORMAL LOW (ref 39.0–52.0)
HCT: 30 % — ABNORMAL LOW (ref 39.0–52.0)
HCT: 25 % — ABNORMAL LOW (ref 39.0–52.0)
HCT: 22 % — ABNORMAL LOW (ref 39.0–52.0)
HCT: 23 % — ABNORMAL LOW (ref 39.0–52.0)
Hemoglobin: 11.2 g/dL — ABNORMAL LOW (ref 13.0–17.0)
Hemoglobin: 10.2 g/dL — ABNORMAL LOW (ref 13.0–17.0)
Hemoglobin: 8.5 g/dL — ABNORMAL LOW (ref 13.0–17.0)
Hemoglobin: 7.5 g/dL — ABNORMAL LOW (ref 13.0–17.0)
Hemoglobin: 7.8 g/dL — ABNORMAL LOW (ref 13.0–17.0)
Potassium: 4.4 mmol/L (ref 3.5–5.1)
Potassium: 4.8 mmol/L (ref 3.5–5.1)
Potassium: 4.9 mmol/L (ref 3.5–5.1)
Potassium: 6.7 mmol/L (ref 3.5–5.1)
Potassium: 6.8 mmol/L (ref 3.5–5.1)
Sodium: 141 mmol/L (ref 135–145)
Sodium: 141 mmol/L (ref 135–145)
Sodium: 141 mmol/L (ref 135–145)
Sodium: 137 mmol/L (ref 135–145)
Sodium: 139 mmol/L (ref 135–145)
TCO2: 24 mmol/L (ref 22–32)
TCO2: 22 mmol/L (ref 22–32)
TCO2: 25 mmol/L (ref 22–32)
TCO2: 23 mmol/L (ref 22–32)
TCO2: 24 mmol/L (ref 22–32)

## 2020-06-24 LAB — HEMOGLOBIN AND HEMATOCRIT, BLOOD
HCT: 24.6 % — ABNORMAL LOW (ref 39.0–52.0)
Hemoglobin: 8.6 g/dL — ABNORMAL LOW (ref 13.0–17.0)

## 2020-06-24 LAB — CBC
HCT: 32.1 % — ABNORMAL LOW (ref 39.0–52.0)
HCT: 30.4 % — ABNORMAL LOW (ref 39.0–52.0)
Hemoglobin: 11.3 g/dL — ABNORMAL LOW (ref 13.0–17.0)
Hemoglobin: 10.5 g/dL — ABNORMAL LOW (ref 13.0–17.0)
MCH: 31.6 pg (ref 26.0–34.0)
MCH: 30.7 pg (ref 26.0–34.0)
MCHC: 35.2 g/dL (ref 30.0–36.0)
MCHC: 34.5 g/dL (ref 30.0–36.0)
MCV: 89.7 fL (ref 80.0–100.0)
MCV: 88.9 fL (ref 80.0–100.0)
Platelets: 115 10*3/uL — ABNORMAL LOW (ref 150–400)
Platelets: 116 10*3/uL — ABNORMAL LOW (ref 150–400)
RBC: 3.58 MIL/uL — ABNORMAL LOW (ref 4.22–5.81)
RBC: 3.42 MIL/uL — ABNORMAL LOW (ref 4.22–5.81)
RDW: 12.5 % (ref 11.5–15.5)
RDW: 12.8 % (ref 11.5–15.5)
WBC: 14.9 10*3/uL — ABNORMAL HIGH (ref 4.0–10.5)
WBC: 12.4 10*3/uL — ABNORMAL HIGH (ref 4.0–10.5)
nRBC: 0 % (ref 0.0–0.2)
nRBC: 0 % (ref 0.0–0.2)

## 2020-06-24 LAB — ECHO INTRAOPERATIVE TEE
AR max vel: 2.88 cm2
AV Area VTI: 2.74 cm2
AV Area mean vel: 2.7 cm2
AV Mean grad: 3 mmHg
AV Peak grad: 4.2 mmHg
Ao pk vel: 1.03 m/s
Area-P 1/2: 4.71 cm2
Height: 67 in
MV M vel: 4.5 m/s
MV Peak grad: 81 mmHg
Radius: 0.5 cm
Weight: 2928 oz

## 2020-06-24 LAB — PROTIME-INR
INR: 1.3 — ABNORMAL HIGH (ref 0.8–1.2)
Prothrombin Time: 16.3 seconds — ABNORMAL HIGH (ref 11.4–15.2)

## 2020-06-24 LAB — PLATELET COUNT: Platelets: 145 10*3/uL — ABNORMAL LOW (ref 150–400)

## 2020-06-24 LAB — APTT: aPTT: 32 seconds (ref 24–36)

## 2020-06-24 LAB — ABO/RH: ABO/RH(D): A POS

## 2020-06-24 LAB — PREPARE RBC (CROSSMATCH)

## 2020-06-24 SURGERY — CORONARY ARTERY BYPASS GRAFTING (CABG)
Anesthesia: General | Site: Leg Upper | Laterality: Right

## 2020-06-24 MED ORDER — ORAL CARE MOUTH RINSE
15.0000 mL | OROMUCOSAL | Status: DC
  Administered 2020-06-24 (×3): 15 mL via OROMUCOSAL

## 2020-06-24 MED ORDER — DEXTROSE 50 % IV SOLN: 0.0000 mL | INTRAVENOUS | Status: DC | PRN

## 2020-06-24 MED ORDER — ALBUMIN HUMAN 5 % IV SOLN: INTRAVENOUS | Status: DC | PRN

## 2020-06-24 MED ORDER — NOREPINEPHRINE 4 MG/250ML-% IV SOLN
0.0000 ug/min | INTRAVENOUS | Status: DC
Start: 2020-06-24 — End: 2020-06-25
  Administered 2020-06-24: 2 ug/min via INTRAVENOUS

## 2020-06-24 MED ORDER — ASPIRIN 81 MG PO CHEW: 324.0000 mg | CHEWABLE_TABLET | Freq: Every day | ORAL | Status: DC

## 2020-06-24 MED ORDER — INSULIN REGULAR(HUMAN) IN NACL 100-0.9 UT/100ML-% IV SOLN: INTRAVENOUS | Status: DC

## 2020-06-24 MED ORDER — LEVALBUTEROL HCL 0.63 MG/3ML IN NEBU
0.6300 mg | INHALATION_SOLUTION | Freq: Four times a day (QID) | RESPIRATORY_TRACT | Status: DC
  Administered 2020-06-24: 0.63 mg via RESPIRATORY_TRACT
  Filled 2020-06-24: qty 3

## 2020-06-24 MED ORDER — PROTAMINE SULFATE 10 MG/ML IV SOLN
INTRAVENOUS | Status: DC | PRN
  Administered 2020-06-24: 50 mg via INTRAVENOUS
  Administered 2020-06-24: 30 mg via INTRAVENOUS
  Administered 2020-06-24 (×2): 50 mg via INTRAVENOUS
  Administered 2020-06-24: 30 mg via INTRAVENOUS
  Administered 2020-06-24: 50 mg via INTRAVENOUS
  Administered 2020-06-24 (×2): 20 mg via INTRAVENOUS

## 2020-06-24 MED ORDER — LIDOCAINE 2% (20 MG/ML) 5 ML SYRINGE
INTRAMUSCULAR | Status: DC | PRN
  Administered 2020-06-24: 60 mg via INTRAVENOUS

## 2020-06-24 MED ORDER — FAMOTIDINE IN NACL 20-0.9 MG/50ML-% IV SOLN
20.0000 mg | Freq: Two times a day (BID) | INTRAVENOUS | Status: AC
  Administered 2020-06-24: 20 mg via INTRAVENOUS
  Filled 2020-06-24 (×2): qty 50

## 2020-06-24 MED ORDER — PLATELET RICH PLASMA OPTIME
Status: DC | PRN
  Administered 2020-06-24: 10 mL

## 2020-06-24 MED ORDER — HEPARIN SODIUM (PORCINE) 1000 UNIT/ML IJ SOLN
INTRAMUSCULAR | Status: AC
  Filled 2020-06-24: qty 1

## 2020-06-24 MED ORDER — SODIUM BICARBONATE 8.4 % IV SOLN
INTRAVENOUS | Status: DC | PRN
  Administered 2020-06-24: 50 meq via INTRAVENOUS

## 2020-06-24 MED ORDER — SODIUM CHLORIDE 0.9% FLUSH: 3.0000 mL | INTRAVENOUS | Status: DC | PRN

## 2020-06-24 MED ORDER — CALCIUM CHLORIDE 10 % IV SOLN
INTRAVENOUS | Status: AC
  Filled 2020-06-24: qty 10

## 2020-06-24 MED ORDER — PHENYLEPHRINE HCL-NACL 20-0.9 MG/250ML-% IV SOLN: 0.0000 ug/min | INTRAVENOUS | Status: DC

## 2020-06-24 MED ORDER — PLATELET POOR PLASMA OPTIME
Status: DC | PRN
  Administered 2020-06-24: 10 mL

## 2020-06-24 MED ORDER — CHLORHEXIDINE GLUCONATE CLOTH 2 % EX PADS
6.0000 | MEDICATED_PAD | Freq: Every day | CUTANEOUS | Status: DC
  Administered 2020-06-24 – 2020-06-26 (×3): 6 via TOPICAL

## 2020-06-24 MED ORDER — HEMOSTATIC AGENTS (NO CHARGE) OPTIME
TOPICAL | Status: DC | PRN
  Administered 2020-06-24: 1 via TOPICAL

## 2020-06-24 MED ORDER — OXYCODONE HCL 5 MG PO TABS: 5.0000 mg | ORAL_TABLET | ORAL | Status: DC | PRN

## 2020-06-24 MED ORDER — ROCURONIUM BROMIDE 10 MG/ML (PF) SYRINGE
PREFILLED_SYRINGE | INTRAVENOUS | Status: DC | PRN
  Administered 2020-06-24: 50 mg via INTRAVENOUS
  Administered 2020-06-24: 100 mg via INTRAVENOUS
  Administered 2020-06-24: 60 mg via INTRAVENOUS
  Administered 2020-06-24: 40 mg via INTRAVENOUS

## 2020-06-24 MED ORDER — DOCUSATE SODIUM 100 MG PO CAPS
200.0000 mg | ORAL_CAPSULE | Freq: Every day | ORAL | Status: DC
  Administered 2020-06-25 – 2020-06-26 (×2): 200 mg via ORAL
  Filled 2020-06-24 (×2): qty 2

## 2020-06-24 MED ORDER — CALCIUM CHLORIDE 10 % IV SOLN
INTRAVENOUS | Status: DC | PRN
  Administered 2020-06-24: 50 mg via INTRAVENOUS
  Administered 2020-06-24: 100 mg via INTRAVENOUS

## 2020-06-24 MED ORDER — CEFAZOLIN SODIUM-DEXTROSE 2-4 GM/100ML-% IV SOLN
2.0000 g | Freq: Three times a day (TID) | INTRAVENOUS | Status: AC
  Administered 2020-06-24 – 2020-06-26 (×6): 2 g via INTRAVENOUS
  Filled 2020-06-24 (×6): qty 100

## 2020-06-24 MED ORDER — PROPOFOL 10 MG/ML IV BOLUS
INTRAVENOUS | Status: AC
  Filled 2020-06-24: qty 20

## 2020-06-24 MED ORDER — SODIUM CHLORIDE 0.9 % IV SOLN
20.0000 ug | Freq: Once | INTRAVENOUS | Status: AC
  Administered 2020-06-24: 20 ug via INTRAVENOUS
  Filled 2020-06-24: qty 5

## 2020-06-24 MED ORDER — ONDANSETRON HCL 4 MG/2ML IJ SOLN
4.0000 mg | Freq: Four times a day (QID) | INTRAMUSCULAR | Status: DC | PRN
  Administered 2020-06-25: 4 mg via INTRAVENOUS
  Filled 2020-06-24: qty 2

## 2020-06-24 MED ORDER — PROTAMINE SULFATE 10 MG/ML IV SOLN
INTRAVENOUS | Status: AC
  Filled 2020-06-24: qty 5

## 2020-06-24 MED ORDER — SODIUM CHLORIDE 0.9% FLUSH
3.0000 mL | Freq: Two times a day (BID) | INTRAVENOUS | Status: DC
  Administered 2020-06-25 – 2020-06-26 (×3): 3 mL via INTRAVENOUS

## 2020-06-24 MED ORDER — SODIUM CHLORIDE 0.9 % IV SOLN: 250.0000 mL | INTRAVENOUS | Status: DC

## 2020-06-24 MED ORDER — DEXMEDETOMIDINE HCL IN NACL 400 MCG/100ML IV SOLN: 0.0000 ug/kg/h | INTRAVENOUS | Status: DC

## 2020-06-24 MED ORDER — THROMBIN 5000 UNITS EX SOLR
INTRAVENOUS | Status: DC | PRN
  Administered 2020-06-24: 2 mL

## 2020-06-24 MED ORDER — CHLORHEXIDINE GLUCONATE 0.12 % MT SOLN
OROMUCOSAL | Status: AC
  Administered 2020-06-24: 15 mL
  Filled 2020-06-24: qty 15

## 2020-06-24 MED ORDER — ACETAMINOPHEN 500 MG PO TABS
1000.0000 mg | ORAL_TABLET | Freq: Four times a day (QID) | ORAL | Status: DC
  Administered 2020-06-25 – 2020-06-26 (×7): 1000 mg via ORAL
  Filled 2020-06-24 (×5): qty 2

## 2020-06-24 MED ORDER — CHLORHEXIDINE GLUCONATE 0.12 % MT SOLN
15.0000 mL | OROMUCOSAL | Status: AC
  Administered 2020-06-24: 15 mL via OROMUCOSAL

## 2020-06-24 MED ORDER — LACTATED RINGERS IV SOLN: INTRAVENOUS | Status: DC | PRN

## 2020-06-24 MED ORDER — SODIUM CHLORIDE 0.9% IV SOLUTION: Freq: Once | INTRAVENOUS | Status: DC

## 2020-06-24 MED ORDER — LEVALBUTEROL HCL 0.63 MG/3ML IN NEBU: 0.6300 mg | INHALATION_SOLUTION | Freq: Four times a day (QID) | RESPIRATORY_TRACT | Status: DC | PRN

## 2020-06-24 MED ORDER — LIDOCAINE 2% (20 MG/ML) 5 ML SYRINGE
INTRAMUSCULAR | Status: AC
  Filled 2020-06-24: qty 5

## 2020-06-24 MED ORDER — MIDAZOLAM HCL (PF) 5 MG/ML IJ SOLN
INTRAMUSCULAR | Status: DC | PRN
  Administered 2020-06-24: 4 mg via INTRAVENOUS
  Administered 2020-06-24: 1 mg via INTRAVENOUS
  Administered 2020-06-24: 3 mg via INTRAVENOUS
  Administered 2020-06-24 (×2): 1 mg via INTRAVENOUS

## 2020-06-24 MED ORDER — PANTOPRAZOLE SODIUM 40 MG PO TBEC
40.0000 mg | DELAYED_RELEASE_TABLET | Freq: Every day | ORAL | Status: DC
  Administered 2020-06-26 – 2020-06-29 (×4): 40 mg via ORAL
  Filled 2020-06-24 (×4): qty 1

## 2020-06-24 MED ORDER — ONDANSETRON HCL 4 MG/2ML IJ SOLN
INTRAMUSCULAR | Status: DC | PRN
  Administered 2020-06-24: 4 mg via INTRAVENOUS

## 2020-06-24 MED ORDER — METOPROLOL TARTRATE 12.5 MG HALF TABLET
12.5000 mg | ORAL_TABLET | Freq: Two times a day (BID) | ORAL | Status: DC
  Administered 2020-06-25: 12.5 mg via ORAL
  Filled 2020-06-24: qty 1

## 2020-06-24 MED ORDER — EPHEDRINE SULFATE-NACL 50-0.9 MG/10ML-% IV SOSY
PREFILLED_SYRINGE | INTRAVENOUS | Status: DC | PRN
  Administered 2020-06-24: 10 mg via INTRAVENOUS

## 2020-06-24 MED ORDER — BISACODYL 10 MG RE SUPP: 10.0000 mg | Freq: Every day | RECTAL | Status: DC

## 2020-06-24 MED ORDER — VANCOMYCIN HCL IN DEXTROSE 1-5 GM/200ML-% IV SOLN
1000.0000 mg | Freq: Once | INTRAVENOUS | Status: AC
  Administered 2020-06-24: 1000 mg via INTRAVENOUS
  Filled 2020-06-24: qty 200

## 2020-06-24 MED ORDER — PROPOFOL 10 MG/ML IV BOLUS
INTRAVENOUS | Status: DC | PRN
  Administered 2020-06-24: 150 mg via INTRAVENOUS

## 2020-06-24 MED ORDER — MAGNESIUM SULFATE 4 GM/100ML IV SOLN
4.0000 g | Freq: Once | INTRAVENOUS | Status: AC
  Administered 2020-06-24: 4 g via INTRAVENOUS
  Filled 2020-06-24: qty 100

## 2020-06-24 MED ORDER — SODIUM BICARBONATE 8.4 % IV SOLN
INTRAVENOUS | Status: AC
  Filled 2020-06-24: qty 50

## 2020-06-24 MED ORDER — TRAMADOL HCL 50 MG PO TABS
50.0000 mg | ORAL_TABLET | ORAL | Status: DC | PRN
  Administered 2020-06-25 (×2): 100 mg via ORAL
  Filled 2020-06-24 (×2): qty 2

## 2020-06-24 MED ORDER — SODIUM CHLORIDE 0.9 % IV SOLN: INTRAVENOUS | Status: DC

## 2020-06-24 MED ORDER — ORAL CARE MOUTH RINSE
15.0000 mL | Freq: Two times a day (BID) | OROMUCOSAL | Status: DC
  Administered 2020-06-25 – 2020-06-29 (×9): 15 mL via OROMUCOSAL

## 2020-06-24 MED ORDER — HEMOSTATIC AGENTS (NO CHARGE) OPTIME
TOPICAL | Status: DC | PRN
  Administered 2020-06-24: 2 via TOPICAL

## 2020-06-24 MED ORDER — METOPROLOL TARTRATE 25 MG/10 ML ORAL SUSPENSION: 12.5000 mg | Freq: Two times a day (BID) | ORAL | Status: DC

## 2020-06-24 MED ORDER — LACTATED RINGERS IV SOLN: INTRAVENOUS | Status: DC

## 2020-06-24 MED ORDER — DEXAMETHASONE SODIUM PHOSPHATE 10 MG/ML IJ SOLN
INTRAMUSCULAR | Status: DC | PRN
  Administered 2020-06-24: 5 mg via INTRAVENOUS

## 2020-06-24 MED ORDER — SODIUM CHLORIDE 0.9% FLUSH: 10.0000 mL | INTRAVENOUS | Status: DC | PRN

## 2020-06-24 MED ORDER — FENTANYL CITRATE (PF) 250 MCG/5ML IJ SOLN
INTRAMUSCULAR | Status: DC | PRN
  Administered 2020-06-24: 100 ug via INTRAVENOUS
  Administered 2020-06-24: 150 ug via INTRAVENOUS
  Administered 2020-06-24: 50 ug via INTRAVENOUS
  Administered 2020-06-24: 100 ug via INTRAVENOUS
  Administered 2020-06-24 (×3): 50 ug via INTRAVENOUS
  Administered 2020-06-24: 150 ug via INTRAVENOUS
  Administered 2020-06-24 (×2): 50 ug via INTRAVENOUS
  Administered 2020-06-24: 100 ug via INTRAVENOUS
  Administered 2020-06-24 (×2): 50 ug via INTRAVENOUS
  Administered 2020-06-24: 150 ug via INTRAVENOUS
  Administered 2020-06-24: 50 ug via INTRAVENOUS

## 2020-06-24 MED ORDER — PLASMA-LYTE A IV SOLN: INTRAVENOUS | Status: DC

## 2020-06-24 MED ORDER — MIDAZOLAM HCL 2 MG/2ML IJ SOLN: 2.0000 mg | INTRAMUSCULAR | Status: DC | PRN

## 2020-06-24 MED ORDER — SODIUM CHLORIDE 0.9 % IV SOLN: INTRAVENOUS | Status: DC | PRN

## 2020-06-24 MED ORDER — PHENYLEPHRINE 40 MCG/ML (10ML) SYRINGE FOR IV PUSH (FOR BLOOD PRESSURE SUPPORT)
PREFILLED_SYRINGE | INTRAVENOUS | Status: DC | PRN
  Administered 2020-06-24: 120 ug via INTRAVENOUS
  Administered 2020-06-24: 80 ug via INTRAVENOUS
  Administered 2020-06-24: 40 ug via INTRAVENOUS

## 2020-06-24 MED ORDER — HEPARIN SODIUM (PORCINE) 1000 UNIT/ML IJ SOLN
INTRAMUSCULAR | Status: DC | PRN
  Administered 2020-06-24: 30000 [IU] via INTRAVENOUS

## 2020-06-24 MED ORDER — CHLORHEXIDINE GLUCONATE 0.12% ORAL RINSE (MEDLINE KIT)
15.0000 mL | Freq: Two times a day (BID) | OROMUCOSAL | Status: DC
  Administered 2020-06-24: 15 mL via OROMUCOSAL

## 2020-06-24 MED ORDER — ARTIFICIAL TEARS OPHTHALMIC OINT
TOPICAL_OINTMENT | OPHTHALMIC | Status: AC
  Filled 2020-06-24: qty 3.5

## 2020-06-24 MED ORDER — FENTANYL CITRATE (PF) 250 MCG/5ML IJ SOLN
INTRAMUSCULAR | Status: AC
  Filled 2020-06-24: qty 25

## 2020-06-24 MED ORDER — MIDAZOLAM HCL (PF) 10 MG/2ML IJ SOLN
INTRAMUSCULAR | Status: AC
  Filled 2020-06-24: qty 2

## 2020-06-24 MED ORDER — PROTAMINE SULFATE 10 MG/ML IV SOLN
INTRAVENOUS | Status: AC
  Filled 2020-06-24: qty 25

## 2020-06-24 MED ORDER — ACETAMINOPHEN 650 MG RE SUPP
650.0000 mg | Freq: Once | RECTAL | Status: AC
  Administered 2020-06-24: 650 mg via RECTAL

## 2020-06-24 MED ORDER — MORPHINE SULFATE (PF) 2 MG/ML IV SOLN: 1.0000 mg | INTRAVENOUS | Status: DC | PRN

## 2020-06-24 MED ORDER — NITROGLYCERIN IN D5W 200-5 MCG/ML-% IV SOLN: 0.0000 ug/min | INTRAVENOUS | Status: DC

## 2020-06-24 MED ORDER — ROCURONIUM BROMIDE 10 MG/ML (PF) SYRINGE
PREFILLED_SYRINGE | INTRAVENOUS | Status: AC
  Filled 2020-06-24: qty 10

## 2020-06-24 MED ORDER — LACTATED RINGERS IV SOLN: 500.0000 mL | Freq: Once | INTRAVENOUS | Status: DC | PRN

## 2020-06-24 MED ORDER — LEVALBUTEROL TARTRATE 45 MCG/ACT IN AERO: 2.0000 | INHALATION_SPRAY | Freq: Four times a day (QID) | RESPIRATORY_TRACT | Status: DC

## 2020-06-24 MED ORDER — 0.9 % SODIUM CHLORIDE (POUR BTL) OPTIME
TOPICAL | Status: DC | PRN
  Administered 2020-06-24: 5000 mL

## 2020-06-24 MED ORDER — METOPROLOL TARTRATE 5 MG/5ML IV SOLN: 2.5000 mg | INTRAVENOUS | Status: DC | PRN

## 2020-06-24 MED ORDER — VANCOMYCIN HCL 1000 MG IV SOLR
INTRAVENOUS | Status: AC
  Filled 2020-06-24: qty 3000

## 2020-06-24 MED ORDER — ALBUMIN HUMAN 5 % IV SOLN
250.0000 mL | INTRAVENOUS | Status: DC | PRN
  Administered 2020-06-24 – 2020-06-25 (×2): 12.5 g via INTRAVENOUS

## 2020-06-24 MED ORDER — SODIUM CHLORIDE 0.9% FLUSH
10.0000 mL | Freq: Two times a day (BID) | INTRAVENOUS | Status: DC
  Administered 2020-06-24 – 2020-06-26 (×5): 10 mL

## 2020-06-24 MED ORDER — ARTIFICIAL TEARS OPHTHALMIC OINT
TOPICAL_OINTMENT | OPHTHALMIC | Status: DC | PRN
  Administered 2020-06-24: 1 via OPHTHALMIC

## 2020-06-24 MED ORDER — SODIUM CHLORIDE 0.45 % IV SOLN: INTRAVENOUS | Status: DC | PRN

## 2020-06-24 MED ORDER — ASPIRIN EC 325 MG PO TBEC
325.0000 mg | DELAYED_RELEASE_TABLET | Freq: Every day | ORAL | Status: DC
  Administered 2020-06-25 – 2020-06-26 (×2): 325 mg via ORAL
  Filled 2020-06-24 (×2): qty 1

## 2020-06-24 MED ORDER — POTASSIUM CHLORIDE 10 MEQ/50ML IV SOLN
10.0000 meq | INTRAVENOUS | Status: AC
  Filled 2020-06-24: qty 50

## 2020-06-24 MED ORDER — PLASMA-LYTE A IV SOLN
INTRAVENOUS | Status: DC | PRN
  Administered 2020-06-24: 1000 mL

## 2020-06-24 MED ORDER — COLCHICINE 0.3 MG HALF TABLET
0.3000 mg | ORAL_TABLET | Freq: Two times a day (BID) | ORAL | Status: DC
  Administered 2020-06-25 – 2020-06-26 (×3): 0.3 mg via ORAL
  Filled 2020-06-24 (×5): qty 1

## 2020-06-24 MED ORDER — ACETAMINOPHEN 160 MG/5ML PO SOLN: 1000.0000 mg | Freq: Four times a day (QID) | ORAL | Status: DC

## 2020-06-24 MED ORDER — STERILE WATER FOR INJECTION IJ SOLN
INTRAMUSCULAR | Status: AC
  Filled 2020-06-24: qty 10

## 2020-06-24 MED ORDER — BISACODYL 5 MG PO TBEC
10.0000 mg | DELAYED_RELEASE_TABLET | Freq: Every day | ORAL | Status: DC
  Administered 2020-06-25 – 2020-06-26 (×2): 10 mg via ORAL
  Filled 2020-06-24 (×2): qty 2

## 2020-06-24 MED ORDER — ACETAMINOPHEN 160 MG/5ML PO SOLN: 650.0000 mg | Freq: Once | ORAL | Status: AC

## 2020-06-24 MED FILL — Magnesium Sulfate Inj 50%: INTRAMUSCULAR | Qty: 10 | Status: AC

## 2020-06-24 MED FILL — Potassium Chloride Inj 2 mEq/ML: INTRAVENOUS | Qty: 40 | Status: AC

## 2020-06-24 MED FILL — Heparin Sodium (Porcine) Inj 1000 Unit/ML: INTRAMUSCULAR | Qty: 30 | Status: AC

## 2020-06-24 SURGICAL SUPPLY — 97 items
APPLIER CLIP 9.375 SM OPEN (CLIP) ×5
BAG DECANTER FOR FLEXI CONT (MISCELLANEOUS) ×5 IMPLANT
BLADE CLIPPER SURG (BLADE) ×5 IMPLANT
BLADE STERNUM SYSTEM 6 (BLADE) ×5 IMPLANT
BLADE SURG 15 STRL LF DISP TIS (BLADE) ×4 IMPLANT
BLADE SURG 15 STRL SS (BLADE) ×1
BNDG ELASTIC 4X5.8 VLCR STR LF (GAUZE/BANDAGES/DRESSINGS) ×10 IMPLANT
BNDG ELASTIC 6X15 VLCR STRL LF (GAUZE/BANDAGES/DRESSINGS) ×5 IMPLANT
BNDG ELASTIC 6X5.8 VLCR STR LF (GAUZE/BANDAGES/DRESSINGS) ×5 IMPLANT
BNDG GAUZE ELAST 4 BULKY (GAUZE/BANDAGES/DRESSINGS) ×10 IMPLANT
CANISTER SUCT 3000ML PPV (MISCELLANEOUS) ×5 IMPLANT
CANNULA NON VENT 20FR 12 (CANNULA) ×5 IMPLANT
CATH CPB KIT HENDRICKSON (MISCELLANEOUS) ×5 IMPLANT
CATH RETROPLEGIA CORONARY 14FR (CATHETERS) ×5 IMPLANT
CATH ROBINSON RED A/P 18FR (CATHETERS) ×15 IMPLANT
CLIP APPLIE 9.375 SM OPEN (CLIP) ×4 IMPLANT
CLIP VESOCCLUDE MED 24/CT (CLIP) ×5 IMPLANT
CLIP VESOCCLUDE SM WIDE 24/CT (CLIP) ×15 IMPLANT
CONTAINER PROTECT SURGISLUSH (MISCELLANEOUS) ×10 IMPLANT
COVER MAYO STAND STRL (DRAPES) ×5 IMPLANT
CUFF TOURN SGL QUICK 24 (TOURNIQUET CUFF)
CUFF TRNQT CYL 24X4X16.5-23 (TOURNIQUET CUFF) IMPLANT
DEFOGGER ANTIFOG KIT (MISCELLANEOUS) ×5 IMPLANT
DERMABOND ADVANCED (GAUZE/BANDAGES/DRESSINGS) ×2
DERMABOND ADVANCED .7 DNX12 (GAUZE/BANDAGES/DRESSINGS) ×8 IMPLANT
DRAIN CHANNEL 28F RND 3/8 FF (WOUND CARE) ×15 IMPLANT
DRAPE CARDIOVASCULAR INCISE (DRAPES) ×1
DRAPE EXTREMITY T 121X128X90 (DISPOSABLE) ×5 IMPLANT
DRAPE HALF SHEET 40X57 (DRAPES) ×5 IMPLANT
DRAPE SRG 135X102X78XABS (DRAPES) ×4 IMPLANT
DRAPE WARM FLUID 44X44 (DRAPES) ×5 IMPLANT
DRSG AQUACEL AG ADV 3.5X14 (GAUZE/BANDAGES/DRESSINGS) ×5 IMPLANT
DRSG TELFA 3X8 NADH (GAUZE/BANDAGES/DRESSINGS) ×5 IMPLANT
ELECT CAUTERY BLADE 6.4 (BLADE) ×10 IMPLANT
ELECT REM PT RETURN 9FT ADLT (ELECTROSURGICAL) ×10
ELECTRODE REM PT RTRN 9FT ADLT (ELECTROSURGICAL) ×8 IMPLANT
FELT TEFLON 1X6 (MISCELLANEOUS) ×5 IMPLANT
GAUZE SPONGE 4X4 12PLY STRL (GAUZE/BANDAGES/DRESSINGS) ×10 IMPLANT
GEL ULTRASOUND 20GR AQUASONIC (MISCELLANEOUS) ×5 IMPLANT
GLOVE SURG NEOP MICRO LF SZ7.5 (GLOVE) ×15 IMPLANT
GOWN STRL REUS W/ TWL LRG LVL3 (GOWN DISPOSABLE) ×16 IMPLANT
GOWN STRL REUS W/TWL LRG LVL3 (GOWN DISPOSABLE) ×4
HEMOSTAT HEMOBLAST BELLOWS (HEMOSTASIS) ×5 IMPLANT
INSERT FOGARTY XLG (MISCELLANEOUS) ×5 IMPLANT
INSERT SUTURE HOLDER (MISCELLANEOUS) ×5 IMPLANT
KIT APPLICATOR RATIO 11:1 (KITS) ×5 IMPLANT
KIT BASIN OR (CUSTOM PROCEDURE TRAY) ×5 IMPLANT
KIT DISPOSABLE SPY ELITE (KITS) ×5 IMPLANT
KIT SUCTION CATH 14FR (SUCTIONS) ×5 IMPLANT
KIT TURNOVER KIT B (KITS) ×5 IMPLANT
KIT VASOVIEW HEMOPRO 2 VH 4000 (KITS) ×5 IMPLANT
NEEDLE 18GX1X1/2 (RX/OR ONLY) (NEEDLE) ×5 IMPLANT
NS IRRIG 1000ML POUR BTL (IV SOLUTION) ×25 IMPLANT
PACK E OPEN HEART (SUTURE) ×5 IMPLANT
PACK OPEN HEART (CUSTOM PROCEDURE TRAY) ×5 IMPLANT
PACK SPY-PHI (KITS) ×5 IMPLANT
PAD ARMBOARD 7.5X6 YLW CONV (MISCELLANEOUS) ×10 IMPLANT
PAD ELECT DEFIB RADIOL ZOLL (MISCELLANEOUS) ×5 IMPLANT
PENCIL BUTTON HOLSTER BLD 10FT (ELECTRODE) ×5 IMPLANT
POSITIONER HEAD DONUT 9IN (MISCELLANEOUS) ×5 IMPLANT
POWDER SURGICEL 3.0 GRAM (HEMOSTASIS) ×10 IMPLANT
PUNCH AORTIC ROTATE 5MM 8IN (MISCELLANEOUS) ×5 IMPLANT
SEALANT SURG COSEAL 8ML (VASCULAR PRODUCTS) ×5 IMPLANT
SET MPS 3-ND DEL (MISCELLANEOUS) ×5 IMPLANT
SHEARS HARMONIC 9CM CVD (BLADE) ×5 IMPLANT
SPONGE LAP 18X18 RF (DISPOSABLE) ×5 IMPLANT
SUT BONE WAX W31G (SUTURE) ×5 IMPLANT
SUT MNCRL AB 3-0 PS2 18 (SUTURE) ×10 IMPLANT
SUT MNCRL AB 4-0 PS2 18 (SUTURE) ×15 IMPLANT
SUT PDS AB 1 CTX 36 (SUTURE) ×10 IMPLANT
SUT PROLENE 3 0 SH DA (SUTURE) ×10 IMPLANT
SUT PROLENE 5 0 C 1 36 (SUTURE) IMPLANT
SUT PROLENE 6 0 C 1 30 (SUTURE) ×15 IMPLANT
SUT PROLENE BLUE 7 0 (SUTURE) ×5 IMPLANT
SUT SILK  1 MH (SUTURE) ×1
SUT SILK 1 MH (SUTURE) ×4 IMPLANT
SUT SILK 3 0 SH CR/8 (SUTURE) IMPLANT
SUT STEEL 6MS V (SUTURE) ×5 IMPLANT
SUT STEEL SZ 6 DBL 3X14 BALL (SUTURE) ×5 IMPLANT
SUT VIC AB 1 CTX 36 (SUTURE) ×1
SUT VIC AB 1 CTX36XBRD ANBCTR (SUTURE) ×4 IMPLANT
SUT VIC AB 2-0 CT1 27 (SUTURE) ×3
SUT VIC AB 2-0 CT1 TAPERPNT 27 (SUTURE) ×12 IMPLANT
SUT VIC AB 3-0 SH 27 (SUTURE) ×1
SUT VIC AB 3-0 SH 27X BRD (SUTURE) ×4 IMPLANT
SYR 30ML LL (SYRINGE) ×5 IMPLANT
SYSTEM SAHARA CHEST DRAIN ATS (WOUND CARE) ×5 IMPLANT
TIP DUAL SPRAY TOPICAL (TIP) ×10 IMPLANT
TOWEL GREEN STERILE (TOWEL DISPOSABLE) ×5 IMPLANT
TOWEL GREEN STERILE FF (TOWEL DISPOSABLE) ×5 IMPLANT
TRAY FOLEY SLVR 16FR TEMP STAT (SET/KITS/TRAYS/PACK) ×5 IMPLANT
TUBING ART PRESS 72  MALE/FEM (TUBING) ×2
TUBING ART PRESS 72 MALE/FEM (TUBING) ×8 IMPLANT
TUBING LAP HI FLOW INSUFFLATIO (TUBING) ×5 IMPLANT
UNDERPAD 30X36 HEAVY ABSORB (UNDERPADS AND DIAPERS) ×5 IMPLANT
WATER STERILE IRR 1000ML POUR (IV SOLUTION) ×10 IMPLANT
WATER STERILE IRR 1000ML UROMA (IV SOLUTION) IMPLANT

## 2020-06-24 NOTE — Procedures (Signed)
Extubation Procedure Note  Patient Details:   Name: Kenneth Collins DOB: December 02, 1945 MRN: [id number]   Airway Documentation:    Vent end date: 06/24/20 Vent end time: 2310   Evaluation  O2 sats: stable throughout Complications: No apparent complications Patient did tolerate procedure well. Bilateral Breath Sounds: Clear, Diminished   Yes  Patient extubated at this time to 4L nasal cannula with humidity. Patient has a cuff leak. Patient tolerated well. Patient able to speak. No stridor noted.   NIF: -30  VC: 1.4 L   Kenneth Collins A Daira Hine 06/24/2020, 11:14 PM

## 2020-06-24 NOTE — Progress Notes (Signed)
Patient placed back on initial weaning settings due to low respiratory rate. RT will continue to monitor patient.

## 2020-06-24 NOTE — Anesthesia Procedure Notes (Signed)
Central Venous Catheter Insertion Performed by: Darral Dash, DO, anesthesiologist Start/End6/24/2022 7:32 AM, 06/24/2020 7:36 AM Patient location: Pre-op. Preanesthetic checklist: patient identified, IV checked, site marked, risks and benefits discussed, surgical consent, monitors and equipment checked, pre-op evaluation, timeout performed and anesthesia consent Position: Trendelenburg Lidocaine 1% used for infiltration and patient sedated Hand hygiene performed  and maximum sterile barriers used  Catheter size: 8.5 Fr Central line was placed.Sheath introducer Procedure performed using ultrasound guided technique. Ultrasound Notes:anatomy identified, needle tip was noted to be adjacent to the nerve/plexus identified, no ultrasound evidence of intravascular and/or intraneural injection and image(s) printed for medical record Attempts: 1 Following insertion, line sutured, dressing applied and Biopatch. Post procedure assessment: blood return through all ports, free fluid flow and no air  Patient tolerated the procedure well with no immediate complications.

## 2020-06-24 NOTE — Anesthesia Procedure Notes (Signed)
Procedure Name: Intubation Date/Time: 06/24/2020 8:37 AM Performed by: Sherlie Ban, RN Pre-anesthesia Checklist: Patient identified, Emergency Drugs available, Suction available and Patient being monitored Patient Re-evaluated:Patient Re-evaluated prior to induction Oxygen Delivery Method: Circle System Utilized Preoxygenation: Pre-oxygenation with 100% oxygen Induction Type: IV induction Ventilation: Mask ventilation without difficulty Laryngoscope Size: Mac and 4 Grade View: Grade I Tube type: Oral Tube size: 8.0 mm Number of attempts: 1 Airway Equipment and Method: Stylet and Oral airway Placement Confirmation: ETT inserted through vocal cords under direct vision, positive ETCO2 and breath sounds checked- equal and bilateral Secured at: 24 cm Tube secured with: Tape Dental Injury: Teeth and Oropharynx as per pre-operative assessment  Comments: Placed by C. Ernestine Conrad, [place].

## 2020-06-24 NOTE — Progress Notes (Signed)
Patient ID: Kenneth Collins, male   DOB: 1945/11/08, 75 y.o.   MRN: [id number]  TCTS Evening Rounds:   Hemodynamically stable  CI = 2.6  Has started to wake up on vent.   Urine output good  CT output low  CBC    Component Value Date/Time   WBC 14.9 (H) 06/24/2020 1359   RBC 3.58 (L) 06/24/2020 1359   HGB 11.3 (L) 06/24/2020 1359   HGB 12.6 (L) 05/16/2020 1011   HCT 32.1 (L) 06/24/2020 1359   HCT 36.2 (L) 05/16/2020 1011   PLT 115 (L) 06/24/2020 1359   PLT 200 05/16/2020 1011   MCV 89.7 06/24/2020 1359   MCV 91 05/16/2020 1011   MCH 31.6 06/24/2020 1359   MCHC 35.2 06/24/2020 1359   RDW 12.5 06/24/2020 1359   RDW 12.4 05/16/2020 1011   LYMPHSABS 1.7 05/16/2020 1011   EOSABS 0.3 05/16/2020 1011   BASOSABS 0.0 05/16/2020 1011     BMET    Component Value Date/Time   NA 139 06/24/2020 1215   NA 141 05/16/2020 1011   K 6.7 (HH) 06/24/2020 1215   CL 109 06/24/2020 1215   CO2 20 (L) 06/23/2020 1059   GLUCOSE 128 (H) 06/24/2020 1215   BUN 34 (H) 06/24/2020 1215   BUN 34 (H) 05/16/2020 1011   CREATININE 1.70 (H) 06/24/2020 1215   CREATININE 1.77 (H) 11/06/2016 0850   CALCIUM 8.6 (L) 06/23/2020 1059   GFRNONAA 34 (L) 06/23/2020 1059   GFRNONAA 37 (L) 11/06/2016 0850   GFRAA 36 (L) 05/12/2019 1024   GFRAA 43 (L) 11/06/2016 0850     A/P:  Stable postop course. Continue current plans. K+ down to 5.8 postop. Will follow.

## 2020-06-24 NOTE — H&P (Signed)
History and Physical Interval Note:  06/24/2020 7:36 AM  Kenneth Collins  has presented today for surgery, with the diagnosis of CAD.  The various methods of treatment have been discussed with the patient and family. After consideration of risks, benefits and other options for treatment, the patient has consented to  Procedure(s): CORONARY ARTERY BYPASS GRAFTING (CABG) with possible BIMA (N/A) possible RADIAL ARTERY HARVEST (Right) TRANSESOPHAGEAL ECHOCARDIOGRAM (TEE) (N/A) INDOCYANINE GREEN FLUORESCENCE IMAGING (ICG) (N/A) as a surgical intervention.  The patient's history has been reviewed, patient examined, no change in status, stable for surgery.  I have reviewed the patient's chart and labs.  Questions were answered to the patient's satisfaction.     [place]       [place] [address]             [phone]     CARDIOTHORACIC SURGERY CONSULTATION REPORT  Referring Provider is No ref. provider found Primary Cardiologist is None PCP is Chrismon, Vickki Muff, PA-C  No chief complaint on file.   HPI:  75 year old man is referred for CABG.  He was in his usual state of health with underlying hypertension and renal insufficiency when he began to experience chest pain on a couple of occasions with exertion this past spring.  He presented with the symptoms to his local physicians investigated with a perfusion stress echo which was positive for ischemia.  He underwent left heart catheterization demonstrating severe multivessel disease.  In the meantime his symptoms have been relatively stable on medical therapy.  He is not known to have suffered an MI.  He has preserved LV function.  Past Medical History:  Diagnosis Date   Arthritis    Cancer ([place])    SKIN   COVID-19    Diverticulitis    Edema    FEET/ANKLES   Hypertension    Liver disease     Past Surgical History:  Procedure Laterality Date   BACK SURGERY     CATARACT EXTRACTION  W/PHACO Left 07/31/2018   Procedure: CATARACT EXTRACTION PHACO AND INTRAOCULAR LENS PLACEMENT ([place]);  Surgeon: Marchia Meiers, MD;  Location: [facility] ORS;  Service: Ophthalmology;  Laterality: Left;  [place] 00:37.4 CDE 5.06 Fluid Pack Lot # [id number]   CATARACT EXTRACTION W/PHACO Right 08/28/2018   Procedure: CATARACT EXTRACTION PHACO AND INTRAOCULAR LENS PLACEMENT ([facility]);  Surgeon: Marchia Meiers, MD;  Location: [facility] ORS;  Service: Ophthalmology;  Laterality: Right;  [place] 00:49.3 CDE 6.95 Fluid Pack Lot # [id number]   COLONOSCOPY WITH PROPOFOL N/A 09/20/2015   Procedure: COLONOSCOPY WITH PROPOFOL;  Surgeon: [name], MD;  Location: [facility] ENDOSCOPY;  Service: Endoscopy;  Laterality: N/A;   LEFT HEART CATH AND CORONARY ANGIOGRAPHY N/A 06/14/2020   Procedure: LEFT HEART CATH AND CORONARY ANGIOGRAPHY;  Surgeon: Isaias Cowman, MD;  Location: [place] CV LAB;  Service: Cardiovascular;  Laterality: N/A;   MICRODISCECTOMY LUMBAR  03/17/2013   VASECTOMY      Family History  Problem Relation Age of Onset   Heart disease Father    Pneumonia Mother    Cancer Maternal Uncle     Social History   Socioeconomic History   Marital status: Married    Spouse name: Not on file   Number of children: 2   Years of education: Not on file   Highest education level: 12th grade  Occupational History   Occupation: retired  Tobacco Use   Smoking status: Former    Psychologist, educational  years: 0.00    Types: Cigarettes   Smokeless tobacco: Never   Tobacco comments:    > 50 years ago  Vaping Use   Vaping Use: Never used  Substance and Sexual Activity   Alcohol use: Not Currently    Alcohol/week: 0.0 standard drinks   Drug use: No   Sexual activity: Not on file  Other Topics Concern   Not on file  Social History Narrative   Not on file   Social Determinants of Health   Financial Resource Strain: Low Risk    Difficulty of Paying Living Expenses: Not hard at all  Food Insecurity: No Food Insecurity   Worried About  Charity fundraiser in the Last Year: Never true   Ran Out of Food in the Last Year: Never true  Transportation Needs: No Transportation Needs   Lack of Transportation (Medical): No   Lack of Transportation (Non-Medical): No  Physical Activity: Sufficiently Active   Days of Exercise per Week: 3 days   Minutes of Exercise per Session: 60 min  Stress: No Stress Concern Present   Feeling of Stress : Not at all  Social Connections: Socially Integrated   Frequency of Communication with Friends and Family: More than three times a week   Frequency of Social Gatherings with Friends and Family: More than three times a week   Attends Religious Services: More than 4 times per year   Active Member of [employer] or Organizations: Yes   Attends Music therapist: More than 4 times per year   Marital Status: Married  Human resources officer Violence: Not At Risk   Fear of Current or Ex-Partner: No   Emotionally Abused: No   Physically Abused: No   Sexually Abused: No    Current Facility-Administered Medications  Medication Dose Route Frequency Provider Last Rate Last Admin   ceFAZolin (ANCEF) IVPB 2g/100 mL premix  2 g Intravenous To OR Lashannon Bresnan, Glenice Bow, MD       ceFAZolin (ANCEF) IVPB 2g/100 mL premix  2 g Intravenous To OR Sadako Cegielski, Glenice Bow, MD       dexmedetomidine (PRECEDEX) 400 MCG/100ML (4 mcg/mL) infusion  0.1-0.7 mcg/kg/hr Intravenous To OR Demika Langenderfer, Glenice Bow, MD       EPINEPHrine (ADRENALIN) 5 mg in NS 250 mL (0.02 mg/mL) premix infusion  0-10 mcg/min Intravenous To OR Tawsha Terrero, Glenice Bow, MD       heparin 30,000 units/NS 1000 mL solution for CELLSAVER   Other To OR Dlisa Barnwell, Glenice Bow, MD       heparin sodium (porcine) 5,000 Units, papaverine 60 mg in electrolyte-A (PLASMALYTE-A PH 7.4) 1,000 mL irrigation   Irrigation To OR Kalev Temme, Glenice Bow, MD       insulin regular, human (MYXREDLIN) 100 units/ 100 mL infusion   Intravenous To OR Mihran Lebarron, Glenice Bow, MD       magnesium sulfate (IV Push/IM)  injection 40 mEq  40 mEq Other To OR Tajana Crotteau, Glenice Bow, MD       milrinone (PRIMACOR) 20 MG/100 ML (0.2 mg/mL) infusion  0.3 mcg/kg/min Intravenous To OR Guillaume Weninger, Glenice Bow, MD       nitroGLYCERIN 50 mg in dextrose 5 % 250 mL (0.2 mg/mL) infusion  2-200 mcg/min Intravenous To OR Genevie Elman, Glenice Bow, MD       norepinephrine (LEVOPHED) 4mg  in 249mL premix infusion  0-40 mcg/min Intravenous To OR Coston Mandato Z, MD       phenylephrine (NEOSYNEPHRINE) 20-0.9 MG/250ML-% infusion  30-200 mcg/min Intravenous To  OR Luverta Korte, Glenice Bow, MD       potassium chloride injection 80 mEq  80 mEq Other To OR Saroya Riccobono, Glenice Bow, MD       tranexamic acid (CYKLOKAPRON) 2,500 mg in sodium chloride 0.9 % 250 mL (10 mg/mL) infusion  1.5 mg/kg/hr Intravenous To OR Vega Withrow, Glenice Bow, MD       tranexamic acid (CYKLOKAPRON) bolus via infusion - over 30 minutes 1,245 mg  15 mg/kg Intravenous To OR Abdishakur Gottschall, Glenice Bow, MD       tranexamic acid (CYKLOKAPRON) pump prime solution 166 mg  2 mg/kg Intracatheter To OR Eeshan Verbrugge, Glenice Bow, MD       vancomycin (VANCOREADY) IVPB 1500 mg/300 mL  1,500 mg Intravenous Once Wonda Olds, MD       Facility-Administered Medications Ordered in Other Encounters  Medication Dose Route Frequency Provider Last Rate Last Admin   fentaNYL citrate (PF) (SUBLIMAZE) injection   Intravenous Anesthesia Intra-op Harden Mo, CRNA   50 mcg at 06/24/20 3536   lactated ringers infusion   Intravenous Continuous PRN Harden Mo, CRNA   New Bag at 06/24/20 0700   midazolam PF (VERSED) injection   Intravenous Anesthesia Intra-op Harden Mo, CRNA   1 mg at 06/24/20 0734    No Known Allergies    Review of Systems:   General:  Small weight gain recently  Cardiac:  Denies orthopnea or cardiac dysrhythmias  Respiratory:  No cough or shortness of breath  GI:   Negative for pain or bleeding  GU:   History of renal insufficiency baseline creatinine 1.4  Vascular:  No claudication or DVTs  Neuro:   No  history of stroke TIAs or seizures  Musculoskeletal: History of generalized arthritis  Skin:   Negative  Psych:   Negative  Eyes:   Wears glasses  ENT:   Mild hearing loss  Hematologic:  Negative  Endocrine:  Not diabetic     Physical Exam:   BP (!) 171/64   Pulse 61   Temp 97.8 F (36.6 C) (Oral)   Resp 18   Ht 5\' 7"  (1.702 m)   Wt 83 kg   SpO2 98%   BMI 28.66 kg/m   General:    well-appearing  HEENT:  Unremarkable   Neck:   no JVD, no bruits, no adenopathy   Chest:   clear to auscultation, symmetrical breath sounds, no wheezes, no rhonchi   CV:   RRR, no detectable murmur   Abdomen:  soft, non-tender, no masses   Extremities:  warm, well-perfused, pulses intact throughout, no LE edema  Rectal/GU  Deferred  Neuro:   Grossly non-focal and symmetrical throughout  Skin:   Clean and dry, no rashes, no breakdown   Diagnostic Tests:  Personally reviewed his available imaging studies and agree with their interpretation including left heart catheterization which shows 100% right coronary artery occlusion and 90 to 95% occlusion of the LAD and ramus intermedius vessels.   Impression:  75 year old man with multivessel coronary artery disease.  Agree with suggestion for surgery at the best therapeutic option.   Plan:  Coronary artery bypass grafting on 07/05/2020. Routine preop work-up including ultrasonography and echocardiogram in the week leading up to surgery Report any chest pain at rest to this office or local emergency department   I spent in excess of 30 minutes during the conduct of this office consultation and >50% of this time involved direct face-to-face encounter with the patient for counseling and/or coordination  of their care.          Level 3 Office Consult = 40 minutes         Level 4 Office Consult = 60 minutes         Level 5 Office Consult = 80 minutes  B.  Murvin Natal, MD 06/24/2020 7:36 AM

## 2020-06-24 NOTE — Progress Notes (Signed)
  Echocardiogram Echocardiogram Transesophageal has been performed.  Darlina Sicilian M 06/24/2020, 9:20 AM

## 2020-06-24 NOTE — Hospital Course (Addendum)
  HPI: at time of surgeon evaluation   75 year old man is referred for CABG.  He was in his usual state of health with underlying hypertension and renal insufficiency when he began to experience chest pain on a couple of occasions with exertion this past spring.  He presented with the symptoms to his local physicians investigated with a perfusion stress echo which was positive for ischemia.  He underwent left heart catheterization demonstrating severe multivessel disease.  In the meantime his symptoms have been relatively stable on medical therapy.  He is not known to have suffered an MI.  He has preserved LV function.  The patient and all pertinent studies were reviewed by Dr. Orvan Seen who recommended proceeding with coronary artery surgical revascularization.  Hospital course:  The patient was admitted electively on 06/24/2020 and taken to the operating room at which time he underwent coronary artery bypass grafting x3.  He tolerated procedure well and was taken to the surgical intensive care unit in stable condition.  His hemodynamics and cardiac rhythm remained stable.  He was weaned from the ventilator and extubated by 11:30 PM on the day of surgery.  He was mobilized following day.  The monitoring lines were removed.  Chest tubes were removed on the following day and he was transferred to 4 E. progressive care.  He was diuresed for expected postoperative volume excess.  He was started on low-dose Norvasc in order to optimize arterial graft patency.  He had baseline stage III chronic kidney disease so these creatinine was monitored carefully.  Addendum: Patient no longer had bradycardia as HR in the 80-90's and with PVCs. He was started on low dose Lopressor 12.5 mg bid. He had thrombocytopenia post op. His platelets were up to 123,000 on 06/27. Epicardial pacing wires were removed on 06/27. He had complaints of right thumb pain (open radial artery harvest), although motor and sensory were  intact.

## 2020-06-24 NOTE — Anesthesia Procedure Notes (Signed)
Central Venous Catheter Insertion Performed by: Darral Dash, DO, anesthesiologist Start/End6/24/2022 7:37 AM, 06/24/2020 7:39 AM Patient location: Pre-op. Preanesthetic checklist: patient identified, IV checked, site marked, risks and benefits discussed, surgical consent, monitors and equipment checked, pre-op evaluation, timeout performed and anesthesia consent Position: supine Hand hygiene performed  and maximum sterile barriers used  PA cath was placed.Swan type:thermodilution PA Cath depth:47 Procedure performed without using ultrasound guided technique. Attempts: 1 Patient tolerated the procedure well with no immediate complications.

## 2020-06-24 NOTE — Op Note (Signed)
CARDIOTHORACIC SURGERY OPERATIVE NOTE  Date of Procedure: 06/24/2020  Preoperative Diagnosis: Severe 3-vessel Coronary Artery Disease  Postoperative Diagnosis: Same  Procedure:   Coronary Artery Bypass Grafting x 3  Left Internal Mammary Artery to Distal Left Anterior Descending Coronary Artery; Saphenous Vein Graft to  Posterior Descending Coronary Artery; right radial artery graft to ramus intermedius coronary Artery; Endoscopic Vein Harvest from right thigh  Open right radial artery harvesting Completion graft surveillance with indocyanine green fluorescence angiography  Surgeon: Murvin Natal, MD  Assistant: Evonnie Pat, PA-C T.  Harriet Pho, PA-C  Anesthesia: General  Operative Findings: Mildly reduced left ventricular systolic function Good quality left internal mammary artery conduit Good quality saphenous vein and right radial artery conduit Good quality target vessels for grafting    BRIEF CLINICAL NOTE AND INDICATIONS FOR SURGERY  75 year old man began to experience exertional chest pain this past spring.  He was evaluated and found to be positive for ischemia on perfusion stress echo.  Left heart catheterization demonstrated multivessel coronary artery disease.  He was referred for CABG.  His past medical history is otherwise notable for mild renal insufficiency.  He presents today for CABG.   DETAILS OF THE OPERATIVE PROCEDURE  Preparation:  The patient is brought to the operating room on the above mentioned date and central monitoring was established by the anesthesia team including placement of Swan-Ganz catheter and radial arterial line. The patient is placed in the supine position on the operating table.  Intravenous antibiotics are administered. General endotracheal anesthesia is induced uneventfully. A Foley catheter is placed.  Baseline transesophageal echocardiogram was performed.  Findings were notable for mildly reduced LV function  The patient's chest, abdomen,  both groins, the right upper extremity, and both lower extremities are prepared and draped in a sterile manner. A time out procedure is performed.   Surgical Approach and Conduit Harvest:  Attention is first turned to the right radial artery harvest which is performed with an open technique.  Once the right radial artery is removed the wound is repaired in layers and the right arm was tucked at the side.  A median sternotomy incision was performed and the left internal mammary artery is dissected from the chest wall and prepared for bypass grafting. The left internal mammary artery is notably good quality conduit. Simultaneously, the greater saphenous vein is obtained from the patient's right thigh using endoscopic vein harvest technique. The saphenous vein is notably good quality conduit. After removal of the saphenous vein, the small surgical incisions in the lower extremity are closed with absorbable suture. Following systemic heparinization, the left internal mammary artery was transected distally noted to have excellent flow.   Extracorporeal Cardiopulmonary Bypass and Myocardial Protection:  The pericardium is opened. The ascending aorta is nondiseased in appearance. The ascending aorta and the right atrium are cannulated for cardiopulmonary bypass.  Adequate heparinization is verified.    A retrograde cardioplegia cannula is placed through the right atrium into the coronary sinus.  The entire pre-bypass portion of the operation was notable for stable hemodynamics.  Cardiopulmonary bypass was begun and the surface of the heart is inspected. Distal target vessels are selected for coronary artery bypass grafting. A cardioplegia cannula is placed in the ascending aorta.    The patient is allowed to cool passively to 34C systemic temperature.  The aortic cross clamp is applied and cold blood cardioplegia is delivered initially in an antegrade fashion through the aortic root.   Supplemental  cardioplegia is given retrograde through  the coronary sinus catheter.  Iced saline slush is applied for topical hypothermia.  The initial cardioplegic arrest is rapid with early diastolic arrest.  Repeat doses of cardioplegia are administered intermittently throughout the entire cross clamp portion of the operation through the aortic root,  through the coronary sinus catheter, and through subsequently placed vein grafts in order to maintain completely flat electrocardiogram.   Coronary Artery Bypass Grafting:  The  posterior descending branch of the right coronary artery was grafted using a reversed saphenous vein graft in an end-to-side fashion.  At the site of distal anastomosis the target vessel was good quality and measured approximately 1.5 mm in diameter. The ramus intermedius coronary artery was grafted using the right radial artery graft in an end-to-side fashion.  At the site of distal anastomosis the target vessel was good quality and measured approximately 1.5 mm in diameter.  The distal left anterior coronary artery was grafted with the left internal mammary artery in an end-to-side fashion.  At the site of distal anastomosis the target vessel was good quality and measured approximately 1.5 mm in diameter. Anastomotic patency and runoff was confirmed with indocyanine green fluorescence imaging (SPY).   All proximal graft anastomoses were placed directly to the ascending aorta prior to removal of the aortic cross clamp.  De-airing procedures were performed and the aortic cross-clamp was removed.  Procedure Completion:  All proximal and distal coronary anastomoses were inspected for hemostasis and appropriate graft orientation. Epicardial pacing wires are fixed to the right ventricular outflow tract and to the right atrial appendage. The patient is rewarmed to 37C temperature. The patient is weaned and disconnected from cardiopulmonary bypass.  The patient's rhythm at separation from bypass  was sinus bradycardia.  The patient was weaned from cardiopulmonary bypass  without any inotropic support.   Followup transesophageal echocardiogram performed after separation from bypass revealed improved LV function relative to baseline of  The aortic and venous cannula were removed uneventfully. Protamine was administered to reverse the anticoagulation. The mediastinum and pleural space were inspected for hemostasis and irrigated with saline solution. The mediastinum and left pleural space were drained using fluted chest tubes placed through separate stab incisions inferiorly.  The soft tissues anterior to the aorta were reapproximated loosely. The sternum is closed with double strength sternal wire. The soft tissues anterior to the sternum were closed in multiple layers and the skin is closed with a running subcuticular skin closure.  The post-bypass portion of the operation was notable for stable rhythm and hemodynamics.     Disposition:  The patient tolerated the procedure well and is transported to the surgical intensive care in stable condition. There are no intraoperative complications. All sponge instrument and needle counts are verified correct at completion of the operation.    Jayme Cloud, MD 06/24/2020 2:47 PM

## 2020-06-24 NOTE — Anesthesia Procedure Notes (Signed)
Arterial Line Insertion Start/End6/24/2022 7:25 AM, 06/24/2020 7:37 AM Performed by: Harden Mo, CRNA, CRNA  Patient location: Pre-op. Preanesthetic checklist: patient identified, IV checked, site marked, risks and benefits discussed, surgical consent, monitors and equipment checked, pre-op evaluation and anesthesia consent Lidocaine 1% used for infiltration Left, radial was placed Catheter size: 20 G Hand hygiene performed  and maximum sterile barriers used  Allen's test indicative of satisfactory collateral circulation Attempts: 2 Procedure performed without using ultrasound guided technique. Ultrasound Notes:anatomy identified, needle tip was noted to be adjacent to the nerve/plexus identified and no ultrasound evidence of intravascular and/or intraneural injection Following insertion, dressing applied and Biopatch. Post procedure assessment: normal  Patient tolerated the procedure well with no immediate complications.

## 2020-06-24 NOTE — Brief Op Note (Signed)
06/24/2020  11:49 AM  PATIENT:  Jeanmarie Plant  75 y.o. male  PRE-OPERATIVE DIAGNOSIS:  CAD  POST-OPERATIVE DIAGNOSIS:  CAD  PROCEDURE:  Procedure(s): CORONARY ARTERY BYPASS GRAFTING (CABG) X4 USING LEFT INTERNAL MAMMARY ARTERY. RIGHT RADIAL ARTERY. RIGHT ENDOSCOPIC SAPHENOUS VEIN HARVESTING. (N/A) RIGHT RADIAL ARTERY HARVEST (Right) TRANSESOPHAGEAL ECHOCARDIOGRAM (TEE) (N/A) INDOCYANINE GREEN FLUORESCENCE IMAGING (ICG) (N/A) LIM[place]-LAD RIGHT RADIAL -OM SVG-PD EVH 30 MIN RADIAL 25 MINUTES  SURGEON:  Surgeon(s) and Role:    * Murdis Flitton, Glenice Bow, MD - Primary  PHYSICIAN ASSISTANT: WAYNE GOLD PA-C, TESSA CONTE PA-C  ASSISTANTS: STAFF   ANESTHESIA:   general  EBL: per anes  BLOOD ADMINISTERED: 300 CC PRBC  DRAINS:  LEFT PLEURAL AND MEDIASTINAL CHEST DRAINS    LOCAL MEDICATIONS USED:  NONE  SPECIMEN:  No Specimen  DISPOSITION OF SPECIMEN:  N/A  COUNTS:  YES  TOURNIQUET:  * No tourniquets in log *  DICTATION: .Dragon Dictation  PLAN OF CARE: Admit to inpatient   PATIENT DISPOSITION:  ICU - intubated and hemodynamically stable.   Delay start of Pharmacological VTE agent (>24hrs) due to surgical blood loss or risk of bleeding: yes  COMPLICATONS: NO KNOWN

## 2020-06-24 NOTE — Transfer of Care (Signed)
Immediate Anesthesia Transfer of Care Note  Patient: Kenneth Collins  Procedure(s) Performed: CORONARY ARTERY BYPASS GRAFTING (CABG) X3 USING LEFT INTERNAL MAMMARY ARTERY. RIGHT RADIAL ARTERY. RIGHT ENDOSCOPIC SAPHENOUS VEIN HARVESTING. (Chest) RIGHT RADIAL ARTERY HARVEST (Right: Arm Lower) TRANSESOPHAGEAL ECHOCARDIOGRAM (TEE) INDOCYANINE GREEN FLUORESCENCE IMAGING (ICG) ENDOVEIN HARVEST OF GREATER SAPHENOUS VEIN (Right: Leg Upper)  Patient Location: ICU  Anesthesia Type:General  Level of Consciousness: sedated, unresponsive and Patient remains intubated per anesthesia plan  Airway & Oxygen Therapy: Patient remains intubated per anesthesia plan and Patient placed on Ventilator (see vital sign flow sheet for setting)  Post-op Assessment: Report given to RN and Post -op Vital signs reviewed and stable  Post vital signs: Reviewed and stable  Last Vitals:  Vitals Value Taken Time  BP    Temp    Pulse 80 06/24/20 1329  Resp 19 06/24/20 1329  SpO2 100 % 06/24/20 1329  Vitals shown include unvalidated device data.  Last Pain:  Vitals:   06/24/20 0724  TempSrc:   PainSc: 0-No pain         Complications: No notable events documented.

## 2020-06-25 ENCOUNTER — Inpatient Hospital Stay (HOSPITAL_COMMUNITY): Payer: Medicare HMO

## 2020-06-25 LAB — CBC
HCT: 28.9 % — ABNORMAL LOW (ref 39.0–52.0)
HCT: 30.3 % — ABNORMAL LOW (ref 39.0–52.0)
Hemoglobin: 10 g/dL — ABNORMAL LOW (ref 13.0–17.0)
Hemoglobin: 10.3 g/dL — ABNORMAL LOW (ref 13.0–17.0)
MCH: 31 pg (ref 26.0–34.0)
MCH: 31.2 pg (ref 26.0–34.0)
MCHC: 34.6 g/dL (ref 30.0–36.0)
MCHC: 34 g/dL (ref 30.0–36.0)
MCV: 89.5 fL (ref 80.0–100.0)
MCV: 91.8 fL (ref 80.0–100.0)
Platelets: 116 10*3/uL — ABNORMAL LOW (ref 150–400)
Platelets: 128 10*3/uL — ABNORMAL LOW (ref 150–400)
RBC: 3.23 MIL/uL — ABNORMAL LOW (ref 4.22–5.81)
RBC: 3.3 MIL/uL — ABNORMAL LOW (ref 4.22–5.81)
RDW: 13.1 % (ref 11.5–15.5)
RDW: 13.4 % (ref 11.5–15.5)
WBC: 12.9 10*3/uL — ABNORMAL HIGH (ref 4.0–10.5)
WBC: 18 10*3/uL — ABNORMAL HIGH (ref 4.0–10.5)
nRBC: 0 % (ref 0.0–0.2)
nRBC: 0 % (ref 0.0–0.2)

## 2020-06-25 LAB — POCT I-STAT 7, (LYTES, BLD GAS, ICA,H+H)
Acid-base deficit: 7 mmol/L — ABNORMAL HIGH (ref 0.0–2.0)
Bicarbonate: 18.7 mmol/L — ABNORMAL LOW (ref 20.0–28.0)
Calcium, Ion: 1.09 mmol/L — ABNORMAL LOW (ref 1.15–1.40)
HCT: 27 % — ABNORMAL LOW (ref 39.0–52.0)
Hemoglobin: 9.2 g/dL — ABNORMAL LOW (ref 13.0–17.0)
O2 Saturation: 98 %
Patient temperature: 36.7
Potassium: 3.9 mmol/L (ref 3.5–5.1)
Sodium: 142 mmol/L (ref 135–145)
TCO2: 20 mmol/L — ABNORMAL LOW (ref 22–32)
pCO2 arterial: 36.4 mmHg (ref 32.0–48.0)
pH, Arterial: 7.319 — ABNORMAL LOW (ref 7.350–7.450)
pO2, Arterial: 109 mmHg — ABNORMAL HIGH (ref 83.0–108.0)

## 2020-06-25 LAB — GLUCOSE, CAPILLARY
Glucose-Capillary: 100 mg/dL — ABNORMAL HIGH (ref 70–99)
Glucose-Capillary: 106 mg/dL — ABNORMAL HIGH (ref 70–99)
Glucose-Capillary: 124 mg/dL — ABNORMAL HIGH (ref 70–99)
Glucose-Capillary: 128 mg/dL — ABNORMAL HIGH (ref 70–99)
Glucose-Capillary: 132 mg/dL — ABNORMAL HIGH (ref 70–99)
Glucose-Capillary: 132 mg/dL — ABNORMAL HIGH (ref 70–99)
Glucose-Capillary: 133 mg/dL — ABNORMAL HIGH (ref 70–99)
Glucose-Capillary: 137 mg/dL — ABNORMAL HIGH (ref 70–99)
Glucose-Capillary: 143 mg/dL — ABNORMAL HIGH (ref 70–99)
Glucose-Capillary: 149 mg/dL — ABNORMAL HIGH (ref 70–99)
Glucose-Capillary: 149 mg/dL — ABNORMAL HIGH (ref 70–99)
Glucose-Capillary: 157 mg/dL — ABNORMAL HIGH (ref 70–99)
Glucose-Capillary: 175 mg/dL — ABNORMAL HIGH (ref 70–99)

## 2020-06-25 LAB — BASIC METABOLIC PANEL
Anion gap: 7 (ref 5–15)
Anion gap: 7 (ref 5–15)
BUN: 29 mg/dL — ABNORMAL HIGH (ref 8–23)
BUN: 31 mg/dL — ABNORMAL HIGH (ref 8–23)
CO2: 20 mmol/L — ABNORMAL LOW (ref 22–32)
CO2: 23 mmol/L (ref 22–32)
Calcium: 7.2 mg/dL — ABNORMAL LOW (ref 8.9–10.3)
Calcium: 7.2 mg/dL — ABNORMAL LOW (ref 8.9–10.3)
Chloride: 111 mmol/L (ref 98–111)
Chloride: 104 mmol/L (ref 98–111)
Creatinine, Ser: 1.93 mg/dL — ABNORMAL HIGH (ref 0.61–1.24)
Creatinine, Ser: 2 mg/dL — ABNORMAL HIGH (ref 0.61–1.24)
GFR, Estimated: 35 mL/min — ABNORMAL LOW (ref >60)
GFR, Estimated: 34 mL/min — ABNORMAL LOW (ref >60)
Glucose, Bld: 154 mg/dL — ABNORMAL HIGH (ref 70–99)
Glucose, Bld: 147 mg/dL — ABNORMAL HIGH (ref 70–99)
Potassium: 4.3 mmol/L (ref 3.5–5.1)
Potassium: 4.2 mmol/L (ref 3.5–5.1)
Sodium: 138 mmol/L (ref 135–145)
Sodium: 134 mmol/L — ABNORMAL LOW (ref 135–145)

## 2020-06-25 LAB — MAGNESIUM
Magnesium: 2.6 mg/dL — ABNORMAL HIGH (ref 1.7–2.4)
Magnesium: 2.6 mg/dL — ABNORMAL HIGH (ref 1.7–2.4)

## 2020-06-25 MED ORDER — INSULIN ASPART 100 UNIT/ML IJ SOLN
0.0000 [IU] | Freq: Three times a day (TID) | INTRAMUSCULAR | Status: DC
  Administered 2020-06-25 (×2): 2 [IU] via SUBCUTANEOUS

## 2020-06-25 MED ORDER — ENOXAPARIN SODIUM 40 MG/0.4ML IJ SOSY
40.0000 mg | PREFILLED_SYRINGE | Freq: Every day | INTRAMUSCULAR | Status: DC
  Administered 2020-06-25 – 2020-06-28 (×4): 40 mg via SUBCUTANEOUS
  Filled 2020-06-25 (×4): qty 0.4

## 2020-06-25 MED ORDER — METOPROLOL TARTRATE 25 MG/10 ML ORAL SUSPENSION: 25.0000 mg | Freq: Two times a day (BID) | ORAL | Status: DC

## 2020-06-25 MED ORDER — TRAMADOL HCL 50 MG PO TABS
50.0000 mg | ORAL_TABLET | ORAL | Status: DC | PRN
  Administered 2020-06-26: 50 mg via ORAL
  Filled 2020-06-25: qty 1

## 2020-06-25 MED ORDER — AMLODIPINE BESYLATE 5 MG PO TABS
2.5000 mg | ORAL_TABLET | Freq: Every day | ORAL | Status: DC
  Administered 2020-06-25 – 2020-06-27 (×3): 2.5 mg via ORAL
  Filled 2020-06-25 (×3): qty 1

## 2020-06-25 MED ORDER — METOPROLOL TARTRATE 25 MG PO TABS: 25.0000 mg | ORAL_TABLET | Freq: Two times a day (BID) | ORAL | Status: DC

## 2020-06-25 NOTE — Progress Notes (Signed)
Patient ID: Kenneth Collins, male   DOB: 11-Oct-1945, 75 y.o.   MRN: [id number] TCTS Evening Rounds:  Hemodynamically stable Sinus rhythm 64. Pacer will not capture atrium so will turn to VVI 50. Hold BB for now.  Urine output ok today.   BMET    Component Value Date/Time   NA 134 (L) 06/25/2020 1546   NA 141 05/16/2020 1011   K 4.2 06/25/2020 1546   CL 104 06/25/2020 1546   CO2 23 06/25/2020 1546   GLUCOSE 147 (H) 06/25/2020 1546   BUN 31 (H) 06/25/2020 1546   BUN 34 (H) 05/16/2020 1011   CREATININE 2.00 (H) 06/25/2020 1546   CREATININE 1.77 (H) 11/06/2016 0850   CALCIUM 7.2 (L) 06/25/2020 1546   GFRNONAA 34 (L) 06/25/2020 1546   GFRNONAA 37 (L) 11/06/2016 0850   GFRAA 36 (L) 05/12/2019 1024   GFRAA 43 (L) 11/06/2016 0850   Creat stable.  Chest tube output low

## 2020-06-25 NOTE — Progress Notes (Signed)
1 Day Post-Op Procedure(s) (LRB): CORONARY ARTERY BYPASS GRAFTING (CABG) X3 USING LEFT INTERNAL MAMMARY ARTERY. RIGHT RADIAL ARTERY. RIGHT ENDOSCOPIC SAPHENOUS VEIN HARVESTING. (N/A) RIGHT RADIAL ARTERY HARVEST (Right) TRANSESOPHAGEAL ECHOCARDIOGRAM (TEE) (N/A) INDOCYANINE GREEN FLUORESCENCE IMAGING (ICG) (N/A) ENDOVEIN HARVEST OF GREATER SAPHENOUS VEIN (Right) Subjective: No complaints  Objective: Vital signs in last 24 hours: Temp:  [94.46 F (34.7 C)-98.6 F (37 C)] 97.88 F (36.6 C) (06/25 0900) Pulse Rate:  [63-88] 69 (06/25 0918) Cardiac Rhythm: Normal sinus rhythm (06/25 0800) Resp:  [0-30] 24 (06/25 0900) BP: (95-147)/(56-83) 139/67 (06/25 0918) SpO2:  [95 %-100 %] 99 % (06/25 0900) Arterial Line BP: (93-284)/(54-100) 167/72 (06/25 0900) FiO2 (%):  [40 %-50 %] 40 % (06/24 2218) Weight:  [88.9 kg] 88.9 kg (06/25 0500)  Hemodynamic parameters for last 24 hours: PAP: (15-33)/(5-19) 32/18 CO:  [3.9 L/min-6.8 L/min] 5.8 L/min CI:  [2 L/min/m2-3.5 L/min/m2] 3 L/min/m2  Intake/Output from previous day: 06/24 0701 - 06/25 0700 In: 5943.3 [P.O.:250; I.V.:3603.3; Blood:595; IV BZJIRCVEL:3810] Out: 1751 [Urine:2335; Blood:509; Chest Tube:520] Intake/Output this shift: Total I/O In: 237.6 [I.V.:237.6] Out: 150 [Urine:150]  General appearance: alert and cooperative Neurologic: intact Heart: regular rate and rhythm, S1, S2 normal, no murmur, click, rub or gallop Lungs: clear to auscultation bilaterally Extremities: edema mild, right hand neurovascularly intact Wound: dressings dry  Lab Results: Recent Labs    06/24/20 2008 06/24/20 2302 06/25/20 0005 06/25/20 0304  WBC 12.4*  --   --  12.9*  HGB 10.5*   < > 9.2* 10.0*  HCT 30.4*   < > 27.0* 28.9*  PLT 116*  --   --  116*   < > = values in this interval not displayed.   BMET:  Recent Labs    06/24/20 2008 06/24/20 2302 06/25/20 0005 06/25/20 0304  NA 138   < > 142 138  K 4.8   < > 3.9 4.3  CL 111  --   --   111  CO2 20*  --   --  20*  GLUCOSE 179*  --   --  154*  BUN 31*  --   --  29*  CREATININE 1.91*  --   --  1.93*  CALCIUM 7.3*  --   --  7.2*   < > = values in this interval not displayed.    PT/INR:  Recent Labs    06/24/20 1359  LABPROT 16.3*  INR 1.3*   ABG    Component Value Date/Time   PHART 7.319 (L) 06/25/2020 0005   HCO3 18.7 (L) 06/25/2020 0005   TCO2 20 (L) 06/25/2020 0005   ACIDBASEDEF 7.0 (H) 06/25/2020 0005   O2SAT 98.0 06/25/2020 0005   CBG (last 3)  Recent Labs    06/25/20 0704 06/25/20 0807 06/25/20 0906  GLUCAP 157* 106* 100*   CXR: clear  ECG: sinus, no acute changes  Assessment/Plan: S/P Procedure(s) (LRB): CORONARY ARTERY BYPASS GRAFTING (CABG) X3 USING LEFT INTERNAL MAMMARY ARTERY. RIGHT RADIAL ARTERY. RIGHT ENDOSCOPIC SAPHENOUS VEIN HARVESTING. (N/A) RIGHT RADIAL ARTERY HARVEST (Right) TRANSESOPHAGEAL ECHOCARDIOGRAM (TEE) (N/A) INDOCYANINE GREEN FLUORESCENCE IMAGING (ICG) (N/A) ENDOVEIN HARVEST OF GREATER SAPHENOUS VEIN (Right)  POD 1 Hemodynamically stable with Htn this am. Increase Lopressor to 25 bid and resume Norvasc 2.5.  Volume excess: wt is 13 lbs over preop. With CKD and mild rise in creat will hold off on diuresis today.  Stage 3 CKD: baseline creat 1.6-1.8. 1.93 today. Will follow. Urine output good.  No hx of DM with preop  Hgb A1c of 5. Will follow CBG's today.  Keep chest tubes in today.  DC swan, arterial line.  Continue IS, ambulation.     LOS: 1 day    Gaye Pollack 06/25/2020

## 2020-06-26 ENCOUNTER — Inpatient Hospital Stay (HOSPITAL_COMMUNITY): Payer: Medicare HMO

## 2020-06-26 ENCOUNTER — Encounter (HOSPITAL_COMMUNITY): Payer: Self-pay | Admitting: Cardiothoracic Surgery

## 2020-06-26 LAB — CBC
HCT: 30.4 % — ABNORMAL LOW (ref 39.0–52.0)
Hemoglobin: 10.1 g/dL — ABNORMAL LOW (ref 13.0–17.0)
MCH: 30.8 pg (ref 26.0–34.0)
MCHC: 33.2 g/dL (ref 30.0–36.0)
MCV: 92.7 fL (ref 80.0–100.0)
Platelets: 108 10*3/uL — ABNORMAL LOW (ref 150–400)
RBC: 3.28 MIL/uL — ABNORMAL LOW (ref 4.22–5.81)
RDW: 13.3 % (ref 11.5–15.5)
WBC: 15.3 10*3/uL — ABNORMAL HIGH (ref 4.0–10.5)
nRBC: 0 % (ref 0.0–0.2)

## 2020-06-26 LAB — BASIC METABOLIC PANEL
Anion gap: 5 (ref 5–15)
BUN: 31 mg/dL — ABNORMAL HIGH (ref 8–23)
CO2: 24 mmol/L (ref 22–32)
Calcium: 7.4 mg/dL — ABNORMAL LOW (ref 8.9–10.3)
Chloride: 107 mmol/L (ref 98–111)
Creatinine, Ser: 1.86 mg/dL — ABNORMAL HIGH (ref 0.61–1.24)
GFR, Estimated: 37 mL/min — ABNORMAL LOW (ref >60)
Glucose, Bld: 106 mg/dL — ABNORMAL HIGH (ref 70–99)
Potassium: 4.2 mmol/L (ref 3.5–5.1)
Sodium: 136 mmol/L (ref 135–145)

## 2020-06-26 LAB — GLUCOSE, CAPILLARY
Glucose-Capillary: 108 mg/dL — ABNORMAL HIGH (ref 70–99)
Glucose-Capillary: 115 mg/dL — ABNORMAL HIGH (ref 70–99)
Glucose-Capillary: 108 mg/dL — ABNORMAL HIGH (ref 70–99)

## 2020-06-26 MED ORDER — SODIUM CHLORIDE 0.9 % IV SOLN: 250.0000 mL | INTRAVENOUS | Status: DC | PRN

## 2020-06-26 MED ORDER — ONDANSETRON HCL 4 MG/2ML IJ SOLN: 4.0000 mg | Freq: Four times a day (QID) | INTRAMUSCULAR | Status: DC | PRN

## 2020-06-26 MED ORDER — SODIUM CHLORIDE 0.9% FLUSH
3.0000 mL | Freq: Two times a day (BID) | INTRAVENOUS | Status: DC
  Administered 2020-06-26 – 2020-06-28 (×5): 3 mL via INTRAVENOUS

## 2020-06-26 MED ORDER — ASPIRIN EC 325 MG PO TBEC
325.0000 mg | DELAYED_RELEASE_TABLET | Freq: Every day | ORAL | Status: DC
  Administered 2020-06-27 – 2020-06-29 (×3): 325 mg via ORAL
  Filled 2020-06-26 (×3): qty 1

## 2020-06-26 MED ORDER — SENNOSIDES-DOCUSATE SODIUM 8.6-50 MG PO TABS: 1.0000 | ORAL_TABLET | Freq: Two times a day (BID) | ORAL | Status: DC | PRN

## 2020-06-26 MED ORDER — SODIUM CHLORIDE 0.9% FLUSH: 3.0000 mL | INTRAVENOUS | Status: DC | PRN

## 2020-06-26 MED ORDER — ATORVASTATIN CALCIUM 80 MG PO TABS
80.0000 mg | ORAL_TABLET | Freq: Every day | ORAL | Status: DC
  Administered 2020-06-26 – 2020-06-29 (×4): 80 mg via ORAL
  Filled 2020-06-26 (×4): qty 1

## 2020-06-26 MED ORDER — POTASSIUM CHLORIDE CRYS ER 20 MEQ PO TBCR
20.0000 meq | EXTENDED_RELEASE_TABLET | Freq: Every day | ORAL | Status: DC
  Administered 2020-06-27 – 2020-06-29 (×3): 20 meq via ORAL
  Filled 2020-06-26 (×3): qty 1

## 2020-06-26 MED ORDER — ~~LOC~~ CARDIAC SURGERY, PATIENT & FAMILY EDUCATION: Freq: Once | Status: AC

## 2020-06-26 MED ORDER — AMLODIPINE BESYLATE 5 MG PO TABS: 2.5000 mg | ORAL_TABLET | Freq: Every day | ORAL | Status: DC

## 2020-06-26 MED ORDER — ONDANSETRON HCL 4 MG PO TABS: 4.0000 mg | ORAL_TABLET | Freq: Four times a day (QID) | ORAL | Status: DC | PRN

## 2020-06-26 MED ORDER — PANTOPRAZOLE SODIUM 40 MG PO TBEC: 40.0000 mg | DELAYED_RELEASE_TABLET | Freq: Every day | ORAL | Status: DC

## 2020-06-26 MED ORDER — FUROSEMIDE 10 MG/ML IJ SOLN
40.0000 mg | Freq: Once | INTRAMUSCULAR | Status: AC
  Administered 2020-06-26: 40 mg via INTRAVENOUS
  Filled 2020-06-26: qty 4

## 2020-06-26 MED ORDER — ACETAMINOPHEN 325 MG PO TABS
650.0000 mg | ORAL_TABLET | Freq: Four times a day (QID) | ORAL | Status: DC | PRN
  Administered 2020-06-27: 650 mg via ORAL
  Filled 2020-06-26: qty 2

## 2020-06-26 MED ORDER — TRAMADOL HCL 50 MG PO TABS: 50.0000 mg | ORAL_TABLET | Freq: Four times a day (QID) | ORAL | Status: DC | PRN

## 2020-06-26 MED ORDER — OXYCODONE HCL 5 MG PO TABS
5.0000 mg | ORAL_TABLET | ORAL | Status: DC | PRN
  Filled 2020-06-26: qty 1

## 2020-06-26 MED ORDER — COLCHICINE 0.3 MG HALF TABLET
0.3000 mg | ORAL_TABLET | Freq: Every day | ORAL | Status: DC
  Administered 2020-06-27 – 2020-06-29 (×3): 0.3 mg via ORAL
  Filled 2020-06-26 (×3): qty 1

## 2020-06-26 NOTE — Progress Notes (Signed)
2 Days Post-Op Procedure(s) (LRB): CORONARY ARTERY BYPASS GRAFTING (CABG) X3 USING LEFT INTERNAL MAMMARY ARTERY. RIGHT RADIAL ARTERY. RIGHT ENDOSCOPIC SAPHENOUS VEIN HARVESTING. (N/A) RIGHT RADIAL ARTERY HARVEST (Right) TRANSESOPHAGEAL ECHOCARDIOGRAM (TEE) (N/A) INDOCYANINE GREEN FLUORESCENCE IMAGING (ICG) (N/A) ENDOVEIN HARVEST OF GREATER SAPHENOUS VEIN (Right) Subjective: No complaints. Ambulated this am.  Objective: Vital signs in last 24 hours: Temp:  [97.6 F (36.4 C)-98.9 F (37.2 C)] 98.9 F (37.2 C) (06/26 0728) Pulse Rate:  [58-72] 67 (06/26 0700) Cardiac Rhythm: Normal sinus rhythm (06/26 0400) Resp:  [0-28] 22 (06/26 0700) BP: (104-154)/(57-71) 143/66 (06/26 0700) SpO2:  [95 %-100 %] 96 % (06/26 0700) Arterial Line BP: (169-171)/(57) 169/57 (06/25 1000) Weight:  [90.3 kg] 90.3 kg (06/26 0500)  Hemodynamic parameters for last 24 hours: PAP: (30-31)/(9) 30/9  Intake/Output from previous day: 06/25 0701 - 06/26 0700 In: 1769.5 [P.O.:460; I.V.:1009.5; IV Piggyback:300] Out: 0109 [NATFT:7322; Chest Tube:430] Intake/Output this shift: No intake/output data recorded.  General appearance: alert and cooperative Neurologic: intact Heart: regular rate and rhythm, S1, S2 normal, no murmur Lungs: clear to auscultation bilaterally Extremities: edema mild, right hand neurovascularly intact Wound: dressing dry  Lab Results: Recent Labs    06/25/20 1546 06/26/20 0355  WBC 18.0* 15.3*  HGB 10.3* 10.1*  HCT 30.3* 30.4*  PLT 128* 108*   BMET:  Recent Labs    06/25/20 1546 06/26/20 0355  NA 134* 136  K 4.2 4.2  CL 104 107  CO2 23 24  GLUCOSE 147* 106*  BUN 31* 31*  CREATININE 2.00* 1.86*  CALCIUM 7.2* 7.4*    PT/INR:  Recent Labs    06/24/20 1359  LABPROT 16.3*  INR 1.3*   ABG    Component Value Date/Time   PHART 7.319 (L) 06/25/2020 0005   HCO3 18.7 (L) 06/25/2020 0005   TCO2 20 (L) 06/25/2020 0005   ACIDBASEDEF 7.0 (H) 06/25/2020 0005   O2SAT 98.0  06/25/2020 0005   CBG (last 3)  Recent Labs    06/25/20 1509 06/25/20 1945 06/26/20 0635  GLUCAP 128* 124* 108*   CXR: rotated but bibasilar atelectasis  Assessment/Plan: S/P Procedure(s) (LRB): CORONARY ARTERY BYPASS GRAFTING (CABG) X3 USING LEFT INTERNAL MAMMARY ARTERY. RIGHT RADIAL ARTERY. RIGHT ENDOSCOPIC SAPHENOUS VEIN HARVESTING. (N/A) RIGHT RADIAL ARTERY HARVEST (Right) TRANSESOPHAGEAL ECHOCARDIOGRAM (TEE) (N/A) INDOCYANINE GREEN FLUORESCENCE IMAGING (ICG) (N/A) ENDOVEIN HARVEST OF GREATER SAPHENOUS VEIN (Right)  POD 2  Hemodynamically stable in sinus rhythm 68. Lopressor held yesterday due to bradycardia and pacing. Will follow heart rate for now. Norvasc 2.5 for radial artery graft.  Stage 3 CKD: baseline creat 1.6-1.8. creat down to 1.86 today. Will give him a dose of lasix IV and start po tomorrow. Follow BMET.  Volume excess: wt is 16 lbs over preop. Up 3 lbs from yesterday.   Glucose under good control and no DM. DC CBG's and SSI.  DC chest tubes, foley and central line.  IS, ambulation  Transfer to 4E.   LOS: 2 days    Gaye Pollack 06/26/2020

## 2020-06-26 NOTE — Progress Notes (Addendum)
Mobility Specialist: Progress Note   06/26/20 1616  Mobility  Activity Ambulated in hall  Level of Assistance Independent  Assistive Device None  Distance Ambulated (ft) 470 ft  Mobility Ambulated independently in hallway  Mobility Response Tolerated well  Mobility performed by Mobility specialist  $Mobility charge 1 Mobility   Pre-Mobility: 81 HR, 154/69 BP, 96% SpO2 Post-Mobility: 72 HR, 176/74 BP, 100% SpO2  Pt asx throughout ambulation. Pt is sitting EOB after walk to eat his dinner. Encouraged pt to walk at least one more time today. Pt can walk independently in hallway, RN aware.   [facility] Alenna Russell Mobility Specialist Mobility Specialist Phone: [phone]

## 2020-06-26 NOTE — Plan of Care (Signed)
Discussed with patient plan of care for the evening, pain management and ambulating to the bathroom with some teach back displayed  Problem: Education: Goal: Will demonstrate proper wound care and an understanding of methods to prevent future damage Outcome: Progressing   Problem: Activity: Goal: Risk for activity intolerance will decrease Outcome: Progressing

## 2020-06-27 ENCOUNTER — Inpatient Hospital Stay (HOSPITAL_COMMUNITY): Payer: Medicare HMO

## 2020-06-27 LAB — BASIC METABOLIC PANEL
Anion gap: 6 (ref 5–15)
BUN: 30 mg/dL — ABNORMAL HIGH (ref 8–23)
CO2: 26 mmol/L (ref 22–32)
Calcium: 7.5 mg/dL — ABNORMAL LOW (ref 8.9–10.3)
Chloride: 102 mmol/L (ref 98–111)
Creatinine, Ser: 1.92 mg/dL — ABNORMAL HIGH (ref 0.61–1.24)
GFR, Estimated: 36 mL/min — ABNORMAL LOW (ref >60)
Glucose, Bld: 108 mg/dL — ABNORMAL HIGH (ref 70–99)
Potassium: 4.1 mmol/L (ref 3.5–5.1)
Sodium: 134 mmol/L — ABNORMAL LOW (ref 135–145)

## 2020-06-27 LAB — CBC
HCT: 29.6 % — ABNORMAL LOW (ref 39.0–52.0)
Hemoglobin: 10.3 g/dL — ABNORMAL LOW (ref 13.0–17.0)
MCH: 31.2 pg (ref 26.0–34.0)
MCHC: 34.8 g/dL (ref 30.0–36.0)
MCV: 89.7 fL (ref 80.0–100.0)
Platelets: 123 10*3/uL — ABNORMAL LOW (ref 150–400)
RBC: 3.3 MIL/uL — ABNORMAL LOW (ref 4.22–5.81)
RDW: 12.9 % (ref 11.5–15.5)
WBC: 13.3 10*3/uL — ABNORMAL HIGH (ref 4.0–10.5)
nRBC: 0 % (ref 0.0–0.2)

## 2020-06-27 LAB — GLUCOSE, CAPILLARY: Glucose-Capillary: 111 mg/dL — ABNORMAL HIGH (ref 70–99)

## 2020-06-27 MED ORDER — ENSURE ENLIVE PO LIQD
237.0000 mL | Freq: Three times a day (TID) | ORAL | Status: DC
  Administered 2020-06-27 – 2020-06-29 (×5): 237 mL via ORAL

## 2020-06-27 MED ORDER — LACTULOSE 10 GM/15ML PO SOLN
20.0000 g | Freq: Once | ORAL | Status: AC
  Administered 2020-06-27: 20 g via ORAL

## 2020-06-27 MED ORDER — FUROSEMIDE 40 MG PO TABS
40.0000 mg | ORAL_TABLET | Freq: Every day | ORAL | Status: DC
  Administered 2020-06-27 – 2020-06-29 (×3): 40 mg via ORAL
  Filled 2020-06-27 (×3): qty 1

## 2020-06-27 MED ORDER — METOPROLOL TARTRATE 12.5 MG HALF TABLET
12.5000 mg | ORAL_TABLET | Freq: Two times a day (BID) | ORAL | Status: DC
  Administered 2020-06-27 – 2020-06-28 (×3): 12.5 mg via ORAL
  Filled 2020-06-27 (×3): qty 1

## 2020-06-27 MED ORDER — BISACODYL 10 MG RE SUPP
10.0000 mg | Freq: Once | RECTAL | Status: DC
  Filled 2020-06-27: qty 1

## 2020-06-27 NOTE — Care Management Important Message (Signed)
Important Message  Patient Details  Name: Kenneth Collins MRN: [id number] Date of Birth: 06/21/1945   Medicare Important Message Given:  Yes     [name] 06/27/2020, 3:28 PM

## 2020-06-27 NOTE — Progress Notes (Addendum)
CARDIAC REHAB PHASE I   PRE:  Rate/Rhythm: 81 SR  BP:  Sitting: 152/74      SaO2: 94 RA  MODE:  Ambulation: 470 ft   POST:  Rate/Rhythm: 106 SR  BP:  Sitting: 170/76      SaO2: 94 RA  Pt ambulated 470 ft w/o assistance. Pt had had SOB w/ exertion. Pt's gait was steady. Pt went to recliner to sit and practice IS. Encouraged pt to continue IS use.   [id number]  Sheppard Plumber, [place] 06/27/2020 10:36 AM

## 2020-06-27 NOTE — Progress Notes (Signed)
EPW removed per order. VSS. Tips intact. Pt tolerated well. Educated on 1 hour bedrest. Call light in reach.  Clyde Canterbury, RN

## 2020-06-27 NOTE — Discharge Instructions (Signed)

## 2020-06-27 NOTE — Progress Notes (Signed)
[address]       [place] [address]             [phone]        3 Days Post-Op Procedure(s) (LRB): CORONARY ARTERY BYPASS GRAFTING (CABG) X3 USING LEFT INTERNAL MAMMARY ARTERY. RIGHT RADIAL ARTERY. RIGHT ENDOSCOPIC SAPHENOUS VEIN HARVESTING. (N/A) RIGHT RADIAL ARTERY HARVEST (Right) TRANSESOPHAGEAL ECHOCARDIOGRAM (TEE) (N/A) INDOCYANINE GREEN FLUORESCENCE IMAGING (ICG) (N/A) ENDOVEIN HARVEST OF GREATER SAPHENOUS VEIN (Right)  Subjective: Patient passing flatus but no bowel movement yet. He does not have much appetite. He also has complaints of right thumb pain;has had since surgery.  Objective: Vital signs in last 24 hours: Temp:  [98 F (36.7 C)-98.9 F (37.2 C)] 98.5 F (36.9 C) (06/27 0350) Pulse Rate:  [62-85] 73 (06/27 0350) Cardiac Rhythm: Normal sinus rhythm (06/26 1930) Resp:  [13-24] 20 (06/27 0605) BP: (121-164)/(56-75) 135/63 (06/27 0350) SpO2:  [94 %-99 %] 94 % (06/27 0350) Weight:  [87.1 kg] 87.1 kg (06/27 0500)  Pre op weight 83 kg Current Weight  06/27/20 87.1 kg      Intake/Output from previous day: 06/26 0701 - 06/27 0700 In: 483 [P.O.:480; I.V.:3] Out: 1900 [Urine:1900]   Physical Exam:  Cardiovascular: RRR, Pulmonary: Slightly diminished left basilar breath sounds Abdomen: Soft, non tender, bowel sounds present. Extremities: Mild bilateral lower extremity edema. Right thumb pain, but motor/sensory intact Wounds: Aquacel dressing removed and wound is clean and dry.  No erythema or signs of infection. RUE wound is clean and dry.  Lab Results: CBC: Recent Labs    06/26/20 0355 06/27/20 0136  WBC 15.3* 13.3*  HGB 10.1* 10.3*  HCT 30.4* 29.6*  PLT 108* 123*   BMET:  Recent Labs    06/26/20 0355 06/27/20 0136  NA 136 134*  K 4.2 4.1  CL 107 102  CO2 24 26  GLUCOSE 106* 108*  BUN 31* 30*  CREATININE 1.86* 1.92*  CALCIUM 7.4* 7.5*    PT/INR:  Lab Results  Component Value Date   INR 1.3 (H)  06/24/2020   INR 1.0 06/23/2020   ABG:  INR: Will add last result for INR, ABG once components are confirmed Will add last 4 CBG results once components are confirmed  Assessment/Plan:  1. CV - Previous bradycardia, pacing. SR, PVCs with HR in the 90's this am. On Amlodipine 2.5 mg daily. Will start low dose BB and monitor 2.  Pulmonary - On room air. CXR this am appears stable (cardiomegaly, left base atelectasis). Encourage incentive spirometer. 3. Volume Overload - He was given Lasix 40 mg IV yesterday and with slight increase in creatinine, will give orally in am and monitor creatinine 4.  Expected post op acute blood loss anemia - H and H this am stable at 10.3 and 29.6 5. Thrombocytopenia-platelets this am increased to 123,000 6. Chronic Kidney Disease   Stage I     GFR >90  Stage II    GFR 60-89  Stage IIIA GFR 45-59  Stage IIIB GFR 30-44  Stage IV   GFR 15-29  Stage V    GFR  <15  Lab Results  Component Value Date   CREATININE 1.92 (H) 06/27/2020   Estimated Creatinine Clearance: 34.5 mL/min (A) (by C-G formula based on SCr of 1.92 mg/dL (H)).-creatinine this am slightly increased to 1.92 (from 1.86) 7. Remove EPW 8. LOC constipation 9. Regarding right thumb pain, monitor as likely related to surgery. Patient able to grip  Rhealynn Myhre M ZimmermanPA-C 06/27/2020,6:57 AM

## 2020-06-27 NOTE — Progress Notes (Signed)
Mobility Specialist: Progress Note   06/27/20 1347  Mobility  Activity Refused mobility   Pt refused mobility d/t c/o tightness/bloating in his abdomen. Pt said he has walked 2x today. Encouraged pt to walk at least one more time today.   [facility] Lanie Schelling Mobility Specialist Mobility Specialist Phone: [phone]

## 2020-06-27 NOTE — Discharge Summary (Signed)
Physician Discharge Summary       [address]       [place] [address]             [phone]    Patient ID: Elih Mooney MRN: [id number] DOB/AGE: 10-Dec-1945 75 y.o.  Admit date: 06/24/2020 Discharge date: 06/29/2020  Admission Diagnoses: Coronary artery disease  Discharge Diagnoses:  1. S/P CABG x 3 2. Expected post op blood loss anemia 3. History of the following: Arthritis      Cancer ([place])      SKIN   COVID-19     Diverticulitis     Edema      FEET/ANKLES   Hypertension     Liver disease    History of CKD (stage IIIb)  Consults: None  Procedure (s):  Coronary Artery Bypass Grafting x 3             Left Internal Mammary Artery to Distal Left Anterior Descending Coronary Artery; Saphenous Vein Graft to  Posterior Descending Coronary Artery; right radial artery graft to ramus intermedius coronary Artery; Endoscopic Vein Harvest from right thigh  Open right radial artery harvesting Completion graft surveillance with indocyanine green fluorescence angiography by Dr. Orvan Seen on 06/24/2020.  History of Presenting Illness: This is a 75 year old man is referred for CABG.  He was in his usual state of health with underlying hypertension and renal insufficiency when he began to experience chest pain on a couple of occasions with exertion this past spring.  He presented with the symptoms to his local physicians investigated with a perfusion stress echo which was positive for ischemia.  He underwent left heart catheterization demonstrating severe multivessel disease.  In the meantime his symptoms have been relatively stable on medical therapy.  He is not known to have suffered an MI.  He has preserved LV function.  The patient and all pertinent studies were reviewed by Dr. Orvan Seen who recommended proceeding with coronary artery surgical revascularization. Dr. Orvan Seen discussed potential risks, benefits, and complications of the surgery. Patient agreed to proceed. Pre  operative carotid duplex US showed no significant internal carotid artery stenosis bilaterally. He underwent a CABG x 3 on 06/24/2020.  Brief Hospital Course:  The patient was admitted electively on 06/24/2020 and taken to the operating room at which time he underwent coronary artery bypass grafting x3.  He tolerated procedure well and was taken to the surgical intensive care unit in stable condition.  His hemodynamics and cardiac rhythm remained stable.  He was weaned from the ventilator and extubated by 11:30 PM on the day of surgery.  He was mobilized following day.  The monitoring lines were removed.  Chest tubes were removed on the following day and he was transferred to 4 E. progressive care.  He was diuresed for expected postoperative volume excess.  He was started on low-dose Norvasc in order to optimize arterial graft patency.  He had baseline stage III chronic kidney disease so these creatinine was monitored carefully.  Addendum: Patient no longer had bradycardia as HR in the 80-90's and with PVCs. He was started on low dose Lopressor 12.5 mg bid. He had thrombocytopenia post op. His platelets were up to 123,000 on 06/27. Epicardial pacing wires were removed on 06/27. He had complaints of right thumb pain (open radial artery harvest), although motor and sensory were intact. His right thumb pain did lessen somewhat (related to open RUE radial artery harvest). Hopefully, this will continue to improve with time. He became more hypertensive  and tachycardic with ambulation so Lopressor and Amlodipine were increased. His creatinine remained 1.9 (baseline appears to be 1.6-1.8). He was restarted on low dose Irbesartan for better control of his SBP. He is below his pre op weight and has no LE edema. He will not be given further diuretics after today. He has been tolerating a diet and has had a bowel movement. His wounds are clean, dry, and healing without signs of infection. Chest tube sutures will be removed  today. He is felt surgically stable for discharge.    Latest Vital Signs: Blood pressure (!) 149/80, pulse 76, temperature 98.6 F (37 C), temperature source Oral, resp. rate (!) 21, height 5\' 7"  (1.702 m), weight 82.3 kg, SpO2 97 %.  Physical Exam: Cardiovascular: RRR Pulmonary: Slightly diminished bibasilar breath sounds Abdomen: Soft, non tender, bowel sounds present. Extremities: No lower extremity edema. Right thumb pain, but motor/sensory intact Wounds: Sternal wound is clean and dry.  No erythema or signs of infection. RUE wound is clean and dry.  Discharge Condition: Stable and discharged to home.  Recent laboratory studies:  Lab Results  Component Value Date   WBC 13.3 (H) 06/27/2020   HGB 10.3 (L) 06/27/2020   HCT 29.6 (L) 06/27/2020   MCV 89.7 06/27/2020   PLT 123 (L) 06/27/2020   Lab Results  Component Value Date   NA 138 06/29/2020   K 3.7 06/29/2020   CL 107 06/29/2020   CO2 25 06/29/2020   CREATININE 1.90 (H) 06/29/2020   GLUCOSE 102 (H) 06/29/2020      Diagnostic Studies: DG Chest 2 View  Result Date: 06/27/2020 CLINICAL DATA:  History of CABG. EXAM: CHEST - 2 VIEW COMPARISON:  06/26/2020 FINDINGS: RIGHT IJ sheath has been removed. Mediastinal drains and LEFT-sided chest tubes have been removed. Heart is enlarged and stable in configuration. Status post median sternotomy. Better lung inflation. There is minimal subsegmental atelectasis at the LEFT lung base. No pneumothorax. There are small bilateral pleural effusions. IMPRESSION: Improved aeration. Subsegmental atelectasis at the LEFT lung base. Small bilateral pleural effusions. Electronically Signed   By: Nolon Nations M.D.   On: 06/27/2020 07:55   DG Chest 2 View  Result Date: 06/24/2020 CLINICAL DATA:  75 year old male with history of coronary artery disease. Preoperative study. EXAM: CHEST - 2 VIEW COMPARISON:  No priors. FINDINGS: Lung volumes are normal. No consolidative airspace disease. No  pleural effusions. No pneumothorax. No pulmonary nodule or mass noted. Pulmonary vasculature and the cardiomediastinal silhouette are within normal limits. IMPRESSION: No radiographic evidence of acute cardiopulmonary disease. Electronically Signed   By: Vinnie Langton M.D.   On: 06/24/2020 10:46   CARDIAC CATHETERIZATION  Result Date: 06/14/2020  Mid RCA lesion is 100% stenosed.  Ost RCA to Prox RCA lesion is 90% stenosed.  Prox LAD lesion is 95% stenosed.  Ramus lesion is 90% stenosed.  1.  Three-vessel coronary artery disease with 95% stenosis ostial LAD, diffuse 90% stenosis large caliber ramus intermedius branch, chronically occluded mid RCA with ipsi and contra collaterals 2.  Mildly reduced left ventricular function estimated LV ejection fraction 45 to 50% with focal inferior wall akinesis Recommendations 3-vessel CABG with LIMA to LAD, SVG to ramus and PDA   DG Chest Port 1 View  Result Date: 06/26/2020 CLINICAL DATA:  CABG surgery on 06/24/2020.  Follow-up exam. EXAM: PORTABLE CHEST 1 VIEW COMPARISON:  06/25/2020. FINDINGS: Since the prior exam, the Swan-Ganz catheter has been removed with the introducer sheath remaining in place. Left chest  tubes and a mediastinal tube are stable. Mild opacity noted at the left lung base consistent with subsegmental atelectasis. Remainder of the lungs is clear. No pneumothorax.  No mediastinal widening. IMPRESSION: 1. No acute findings or evidence of an operative complication. 2. Mild left lung base atelectasis. 3. Remaining support apparatus is stable. Electronically Signed   By: Lajean Manes M.D.   On: 06/26/2020 10:20   DG Chest Port 1 View  Result Date: 06/25/2020 CLINICAL DATA:  Follow-up CABG surgery. EXAM: PORTABLE CHEST 1 VIEW COMPARISON:  06/24/2020 and 06/23/2020. FINDINGS: Since the prior study, the endotracheal tube and nasal/orogastric tube have been removed. The right internal jugular Swan-Ganz catheter, mediastinal tube and left lower  hemithorax chest tubes are unchanged. No mediastinal widening. Mild opacity at the lung bases consistent with atelectasis. Lungs otherwise clear. No pneumothorax. IMPRESSION: 1. No acute findings or evidence of an operative complication. 2. Mild lung base atelectasis. 3. Remaining support apparatus is stable and well positioned. Electronically Signed   By: Lajean Manes M.D.   On: 06/25/2020 07:35   DG Chest Port 1 View  Result Date: 06/24/2020 CLINICAL DATA:  Status post CABG. EXAM: PORTABLE CHEST 1 VIEW COMPARISON:  June 23, 2020. FINDINGS: Postsurgical changes of CABG with median sternotomy. Similar cardiomediastinal silhouette. Right IJ approach Swan-Ganz catheter with the tip projecting in the region of the main pulmonary artery. Mediastinal drain and left chest tubes in place. Endotracheal tube tip projects at the inferior aspect of the clavicular heads. Gastric tube courses below the diaphragm with the tip likely just above the gastroesophageal junction and the side port above the diaphragm. Linear left basilar opacities, likely atelectasis in the postoperative setting. No definite pleural effusions or visible pneumothorax on this single semi erect radiograph. IMPRESSION: 1. Gastric tube tip projects just above the gastroesophageal junction. If intragastric position is desired, recommend advancement. Additional support devices are detailed above. 2. Postsurgical changes of CABG with streaky left basilar opacities, likely atelectasis in the postoperative setting. Electronically Signed   By: Margaretha Sheffield MD   On: 06/24/2020 14:21   ECHO INTRAOPERATIVE TEE  Result Date: 06/24/2020  *INTRAOPERATIVE TRANSESOPHAGEAL REPORT *  Patient Name:   LAMONTA CYPRESS Date of Exam: 06/24/2020 Medical Rec #:  [id number]   Height:       67.0 in Accession #:    [id number]  Weight:       183.0 lb Date of Birth:  1945-06-05   BSA:          1.95 m Patient Age:    37 years    BP:           198/65 mmHg Patient Gender: M            HR:           61 bpm. Exam Location:  Inpatient Transesophogeal exam was perform intraoperatively during surgical procedure. Patient was closely monitored under general anesthesia during the entirety of examination. Indications:     Coronary Artery Disease Sonographer:     Darlina Sicilian RDCS Performing Phys: [id number] [facility] Z ATKINS Diagnosing Phys: Belenda Cruise Stoltzfus Complications: No known complications during this procedure. POST-OP IMPRESSIONS - Left Ventricle: has mildly reduced systolic function. The cavity size was normal. The wall motion is abnormal. - Right Ventricle: The right ventricle appears unchanged from pre-bypass. - Left Atrial Appendage: The left atrial appendage appears unchanged from pre-bypass. - Aortic Valve: The aortic valve appears unchanged from pre-bypass. - Mitral Valve: The mitral valve appears unchanged from  pre-bypass. - Tricuspid Valve: The tricuspid valve appears unchanged from pre-bypass. - Pulmonic Valve: The pulmonic valve appears unchanged from pre-bypass. - Interatrial Septum: The interatrial septum appears unchanged from pre-bypass. - Pericardium: The pericardium appears unchanged from pre-bypass. - Comments: S/P CABG X 3. Emergence from CPB on phenylephrine gtt. No new or worsening valvular or wall motion abnormalities. Mild central MR unchanged. Inferior wall hypokinesis improved from akinetic pre-operatively. PRE-OP FINDINGS  Left Ventricle: The left ventricle has mildly reduced systolic function, with an ejection fraction of 45-50%. The cavity size was normal. There is no increase in left ventricular wall thickness. Moderate akinesis of the left ventricular, basal-mid inferoseptal wall and inferior wall. There is no left ventricular hypertrophy. Left ventricular diastolic parameters were normal. Right Ventricle: The right ventricle has normal systolic function. The cavity was normal. There is no increase in right ventricular wall thickness. Right ventricular systolic  pressure is normal. Left Atrium: Left atrial size was normal in size. No left atrial/left atrial appendage thrombus was detected. Left atrial appendage velocity is normal at greater than 40 cm/s. Right Atrium: Right atrial size was normal in size. Interatrial Septum: No atrial level shunt detected by color flow Doppler. There is no evidence of a patent foramen ovale. Pericardium: There is no evidence of pericardial effusion. Mitral Valve: The mitral valve is normal in structure. Mitral valve regurgitation is mild by color flow Doppler. There is no evidence of mitral valve vegetation. Pulmonary venous flow is normal. There is No evidence of mitral stenosis. Tricuspid Valve: The tricuspid valve was normal in structure. Tricuspid valve regurgitation is trivial by color flow Doppler. No evidence of tricuspid stenosis is present. There is no evidence of tricuspid valve vegetation. Aortic Valve: The aortic valve is tricuspid Aortic valve regurgitation is trivial by color flow Doppler. There is no stenosis of the aortic valve, with a calculated valve area of 2.74 cm. There is no evidence of aortic valve vegetation. Pulmonic Valve: The pulmonic valve was normal in structure, with normal. No evidence of pumonic stenosis. Pulmonic valve regurgitation is mild by color flow Doppler. Aorta: The aortic root and ascending aorta are normal in size and structure. There is evidence of plaque in the descending aorta and ascending aorta; Grade IV, measuring >21mm in size. Pulmonary Artery: The pulmonary artery is of normal size. Venous: The inferior vena cava is normal in size with greater than 50% respiratory variability, suggesting right atrial pressure of 3 mmHg. Shunts: There is no evidence of an atrial septal defect. +--------------+--------++ LEFT VENTRICLE         +----------------+----------++ +--------------+--------++ Diastology                 PLAX 2D                +----------------+----------++  +--------------+--------++ LV e' lateral:  10.20 cm/s LV IVS:       0.76 cm  +----------------+----------++ +--------------+--------++ LV E/e' lateral:5.0        LVOT diam:    2.20 cm  +----------------+----------++ +--------------+--------++ LV e' medial:   7.80 cm/s  LVOT Area:    3.80 cm +----------------+----------++ +--------------+--------++ LV E/e' medial: 6.5                               +----------------+----------++ +--------------+--------++                             +------------+---------++  3D Volume EF                                     +------------+---------++                            LV 3D EDV:  138.00 ml                            +------------+---------++                            LV 3D ESV:  75.00 ml                             +------------+---------++ +------------------+-----------++ AORTIC VALVE                  +------------------+-----------++ AV Area (Vmax):   2.88 cm    +------------------+-----------++ AV Area (Vmean):  2.70 cm    +------------------+-----------++ AV Area (VTI):    2.74 cm    +------------------+-----------++ AV Vmax:          103.00 cm/s +------------------+-----------++ AV Vmean:         76.000 cm/s +------------------+-----------++ AV VTI:           0.250 m     +------------------+-----------++ AV Peak Grad:     4.2 mmHg    +------------------+-----------++ AV Mean Grad:     3.0 mmHg    +------------------+-----------++ LVOT Vmax:        78.00 cm/s  +------------------+-----------++ LVOT Vmean:       54.000 cm/s +------------------+-----------++ LVOT VTI:         0.180 m     +------------------+-----------++ LVOT/AV VTI ratio:0.72        +------------------+-----------++  +-------------+-------++ AORTA                +-------------+-------++ Ao Root diam:3.00 cm +-------------+-------++ Ao STJ diam: 2.3 cm   +-------------+-------++ Ao Asc diam: 3.40 cm +-------------+-------++ +--------------+----------++ MITRAL VALVE               +--------------+-------+ +--------------+----------++   SHUNTS                MV Area (PHT):4.71 cm     +--------------+-------+ +--------------+----------++   Systemic VTI: 0.18 m  MV Peak grad: 0.6 mmHg     +--------------+-------+ +--------------+----------++   Systemic Diam:2.20 cm MV Mean grad: 0.0 mmHg     +--------------+-------+ +--------------+----------++ MV Vmax:      0.40 m/s   +--------------+----------++ MV Vmean:     25.0 cm/s  +--------------+----------++ MV PHT:       46.69 msec +--------------+----------++ MV Decel Time:161 msec   +--------------+----------++ +---------------+-----------++ MR Peak grad:  81.0 mmHg   +---------------+-----------++ MR Mean grad:  50.0 mmHg   +---------------+-----------++ MR Vmax:       450.00 cm/s +---------------+-----------++ MR Vmean:      330.0 cm/s  +---------------+-----------++ MR PISA:       1.57 cm    +---------------+-----------++ MR PISA Radius:0.50 cm     +---------------+-----------++ +--------------+----------++ MV E velocity:50.80 cm/s +--------------+----------++ MV A velocity:43.00 cm/s +--------------+----------++ MV E/A ratio: 1.18       +--------------+----------++  Rochele Pages Electronically signed by Rochele Pages Signature Date/Time: 06/24/2020/1:34:16 PM  Final    VAS US DOPPLER PRE CABG  Result Date: 06/23/2020 PREOPERATIVE VASCULAR EVALUATION Patient Name:  SANDIP POWER  Date of Exam:   06/23/2020 Medical Rec #: [id number]    Accession #:    [id number] Date of Birth: [birth year]    Patient Gender: M Patient Age:   74Y Exam Location:  [facility] Procedure:      VAS US DOPPLER PRE CABG Referring Phys: [id number] [place] --------------------------------------------------------------------------------   Indications:      Pre-CABG. Risk Factors:     Hypertension. Comparison Study: No prior studies. Performing Technologist: Carlos Levering RVT  Examination Guidelines: A complete evaluation includes B-mode imaging, spectral Doppler, color Doppler, and power Doppler as needed of all accessible portions of each vessel. Bilateral testing is considered an integral part of a complete examination. Limited examinations for reoccurring indications may be performed as noted.  Right Carotid Findings: +----------+--------+--------+--------+-----------------------+--------+           PSV cm/sEDV cm/sStenosisDescribe               Comments +----------+--------+--------+--------+-----------------------+--------+ CCA Prox  71      8               smooth and heterogenous         +----------+--------+--------+--------+-----------------------+--------+ CCA Distal76      15              smooth and heterogenous         +----------+--------+--------+--------+-----------------------+--------+ ICA Prox  80      20              smooth and heterogenous         +----------+--------+--------+--------+-----------------------+--------+ ICA Distal62      20                                              +----------+--------+--------+--------+-----------------------+--------+ ECA       213     16                                              +----------+--------+--------+--------+-----------------------+--------+ Portions of this table do not appear on this page. +----------+--------+-------+--------+------------+           PSV cm/sEDV cmsDescribeArm Pressure +----------+--------+-------+--------+------------+ Subclavian112                                 +----------+--------+-------+--------+------------+ +---------+--------+--+--------+--+---------+ VertebralPSV cm/s47EDV cm/s13Antegrade +---------+--------+--+--------+--+---------+ Left Carotid Findings:  +----------+--------+--------+--------+-----------------------+--------+           PSV cm/sEDV cm/sStenosisDescribe               Comments +----------+--------+--------+--------+-----------------------+--------+ CCA Prox  86      12              smooth and heterogenous         +----------+--------+--------+--------+-----------------------+--------+ CCA Distal69      15              smooth and heterogenous         +----------+--------+--------+--------+-----------------------+--------+ ICA Prox  96      19              smooth and heterogenous         +----------+--------+--------+--------+-----------------------+--------+  ICA Distal71      22                                              +----------+--------+--------+--------+-----------------------+--------+ ECA       96      14                                              +----------+--------+--------+--------+-----------------------+--------+ +----------+--------+--------+--------+------------+ SubclavianPSV cm/sEDV cm/sDescribeArm Pressure +----------+--------+--------+--------+------------+           90                                   +----------+--------+--------+--------+------------+ +---------+--------+--+--------+--+---------+ VertebralPSV cm/s39EDV cm/s10Antegrade +---------+--------+--+--------+--+---------+  ABI Findings: +--------+------------------+-----+---------+--------+ Right   Rt Pressure (mmHg)IndexWaveform Comment  +--------+------------------+-----+---------+--------+ [id number]                    triphasic         +--------+------------------+-----+---------+--------+ PTA     191               1.19 triphasic         +--------+------------------+-----+---------+--------+ DP      186               1.16 triphasic         +--------+------------------+-----+---------+--------+ +--------+------------------+-----+---------+-------+ Left    Lt Pressure  (mmHg)IndexWaveform Comment +--------+------------------+-----+---------+-------+ [id number]                    triphasic        +--------+------------------+-----+---------+-------+ PTA     173               1.07 triphasic        +--------+------------------+-----+---------+-------+ DP      155               0.96 triphasic        +--------+------------------+-----+---------+-------+ +-------+---------------+----------------+ ABI/TBIToday's ABI/TBIPrevious ABI/TBI +-------+---------------+----------------+ Right  1.19                            +-------+---------------+----------------+ Left   1.07                            +-------+---------------+----------------+  Right Doppler Findings: +--------+--------+-----+---------+--------+ Site    PressureIndexDoppler  Comments +--------+--------+-----+---------+--------+ [id number]          triphasic         +--------+--------+-----+---------+--------+ Radial               triphasic         +--------+--------+-----+---------+--------+ Ulnar                triphasic         +--------+--------+-----+---------+--------+  Left Doppler Findings: +--------+--------+-----+---------+--------+ Site    PressureIndexDoppler  Comments +--------+--------+-----+---------+--------+ [id number]          triphasic         +--------+--------+-----+---------+--------+ Radial               triphasic         +--------+--------+-----+---------+--------+ Ulnar  triphasic         +--------+--------+-----+---------+--------+  Summary: Right Carotid: Velocities in the right ICA are consistent with a 1-39% stenosis. Left Carotid: Velocities in the left ICA are consistent with a 1-39% stenosis. Vertebrals: Bilateral vertebral arteries demonstrate antegrade flow. Right ABI: Resting right ankle-brachial index is within normal range. No evidence of significant right lower extremity arterial disease.  Left ABI: Resting left ankle-brachial index is within normal range. No evidence of significant left lower extremity arterial disease. Right Upper Extremity: Doppler waveform obliterate with right radial compression. Doppler waveforms remain within normal limits with right ulnar compression. Left Upper Extremity: Doppler waveform obliterate with left radial compression. Doppler waveforms remain within normal limits with left ulnar compression.  Electronically signed by Monica Martinez MD on 06/23/2020 at 6:11:25 PM.    Final     Discharge Instructions     Amb Referral to Cardiac Rehabilitation   Complete by: As directed    Diagnosis: CABG   CABG X ___: 3   After initial evaluation and assessments completed: Virtual Based Care may be provided alone or in conjunction with Phase 2 Cardiac Rehab based on patient barriers.: Yes       Discharge Medications: Allergies as of 06/29/2020   No Known Allergies      Medication List     STOP taking these medications    isosorbide mononitrate 30 MG 24 hr tablet Commonly known as: IMDUR   metoprolol succinate 25 MG 24 hr tablet Commonly known as: TOPROL-XL   nitroGLYCERIN 0.4 MG SL tablet Commonly known as: NITROSTAT       TAKE these medications    acetaminophen 500 MG tablet Commonly known as: TYLENOL Take 500 mg by mouth every 6 (six) hours as needed for mild pain or moderate pain. As needed for mild pain.   amLODipine 10 MG tablet Commonly known as: NORVASC Take 1 tablet (10 mg total) by mouth daily. What changed:  medication strength how much to take   aspirin 325 MG EC tablet Take 1 tablet (325 mg total) by mouth daily. Start taking on: June 30, 2020 What changed:  medication strength how much to take   atorvastatin 80 MG tablet Commonly known as: LIPITOR Take 1 tablet (80 mg total) by mouth daily. Start taking on: June 30, 2020   colchicine 0.6 MG tablet Take 0.5 tablets (0.3 mg total) by mouth daily. Start taking  on: June 30, 2020   Dialyvite Vitamin D 5000 125 MCG (5000 UT) capsule Generic drug: Cholecalciferol Take 5,000 Units by mouth daily.   irbesartan 150 MG tablet Commonly known as: AVAPRO Take 1 tablet (150 mg total) by mouth daily. What changed:  medication strength how much to take   metoprolol tartrate 25 MG tablet Commonly known as: LOPRESSOR Take 1 tablet (25 mg total) by mouth 2 (two) times daily.   traMADol 50 MG tablet Commonly known as: ULTRAM Take 1 tablet (50 mg total) by mouth every 6 (six) hours as needed for moderate pain.   vitamin C 1000 MG tablet Take 1,000 mg by mouth daily.   zinc gluconate 50 MG tablet Take 50 mg by mouth daily.      The patient has been discharged on:   1.Beta Blocker:  Yes [  x ]                              No   [   ]  If No, reason:  2.Ace Inhibitor/ARB: Yes [  x ]                                     No  [   ]                                     If No, reason:  3.Statin:   Yes [ x  ]                  No  [   ]                  If No, reason:  4.Ecasa:  Yes  [  x ]                  No   [   ]                  If No, reason:  Patient had ACS upon admission: NO  Plavix/P2Y12 inhibitor: Yes [   ]                                      No  [  x ]   Follow Up Appointments:  Follow-up Information     Wonda Olds, MD. Go on 07/14/2020.   Specialty: Cardiothoracic Surgery Why: Appointment time is at 12:45 pm Contact information: [place] [address] [phone]         Clabe Seal, PA-C. Go on 07/06/2020.   Why: Appointment time is at 10:00 am Contact information: [place] [address] [phone]                 Signed: Sharalyn Ink [facility] 06/29/2020, 9:46 AM

## 2020-06-28 LAB — BASIC METABOLIC PANEL
Anion gap: 7 (ref 5–15)
BUN: 28 mg/dL — ABNORMAL HIGH (ref 8–23)
CO2: 28 mmol/L (ref 22–32)
Calcium: 7.8 mg/dL — ABNORMAL LOW (ref 8.9–10.3)
Chloride: 104 mmol/L (ref 98–111)
Creatinine, Ser: 1.97 mg/dL — ABNORMAL HIGH (ref 0.61–1.24)
GFR, Estimated: 35 mL/min — ABNORMAL LOW (ref >60)
Glucose, Bld: 111 mg/dL — ABNORMAL HIGH (ref 70–99)
Potassium: 4.1 mmol/L (ref 3.5–5.1)
Sodium: 139 mmol/L (ref 135–145)

## 2020-06-28 LAB — TYPE AND SCREEN
ABO/RH(D): A POS
Antibody Screen: NEGATIVE
Unit division: 0
Unit division: 0

## 2020-06-28 LAB — BPAM RBC
Blood Product Expiration Date: 202207142359
Blood Product Expiration Date: 202207142359
ISSUE DATE / TIME: 202206241158
ISSUE DATE / TIME: 202206241158
Unit Type and Rh: 6200
Unit Type and Rh: 6200

## 2020-06-28 MED ORDER — METOPROLOL TARTRATE 25 MG PO TABS
25.0000 mg | ORAL_TABLET | Freq: Two times a day (BID) | ORAL | Status: DC
  Administered 2020-06-28 – 2020-06-29 (×2): 25 mg via ORAL
  Filled 2020-06-28 (×2): qty 1

## 2020-06-28 MED ORDER — AMLODIPINE BESYLATE 10 MG PO TABS
10.0000 mg | ORAL_TABLET | Freq: Every day | ORAL | Status: DC
  Administered 2020-06-28 – 2020-06-29 (×2): 10 mg via ORAL
  Filled 2020-06-28 (×2): qty 1

## 2020-06-28 MED FILL — Electrolyte-R (PH 7.4) Solution: INTRAVENOUS | Qty: 3000 | Status: AC

## 2020-06-28 MED FILL — Sodium Chloride IV Soln 0.9%: INTRAVENOUS | Qty: 3000 | Status: AC

## 2020-06-28 MED FILL — Mannitol IV Soln 20%: INTRAVENOUS | Qty: 500 | Status: AC

## 2020-06-28 MED FILL — Lidocaine HCl Local Preservative Free (PF) Inj 2%: INTRAMUSCULAR | Qty: 5 | Status: AC

## 2020-06-28 MED FILL — Sodium Bicarbonate IV Soln 8.4%: INTRAVENOUS | Qty: 50 | Status: AC

## 2020-06-28 NOTE — Progress Notes (Signed)
Mobility Specialist: Progress Note   06/28/20 1316  Mobility  Activity Ambulated in hall  Level of Assistance Independent  Assistive Device None  Distance Ambulated (ft) 470 ft  Mobility Ambulated independently in hallway  Mobility Response Tolerated well  Mobility performed by Mobility specialist  $Mobility charge 1 Mobility   Pre-Mobility: 88 HR Post-Mobility: 97 HR, 152/66 BP, 99% SpO2  Pt independent with bed mobility, standing, and ambulation. Pt did bump into the door frame on his R shoulder when exiting the room to walk, no c/o pain and was able to self correct LOB. Pt asx throughout ambulation. Pt back to bed after walk with call bell at his side.   [facility] Shalayah Beagley Mobility Specialist Mobility Specialist Phone: [phone]

## 2020-06-28 NOTE — Progress Notes (Signed)
CARDIAC REHAB PHASE I   PRE:  Rate/Rhythm: 95 SR    BP: sitting 106/65    SaO2:   MODE:  Ambulation: 670 ft   POST:  Rate/Rhythm: 106 ST    BP: sitting 127/75     SaO2: 94 RA  Pt BP now lower therefore ambulated again with appropriate response. No dizziness or lightheadedness, feels well. Discussed IS, sternal precautions, diet, exercise, and CRPII with pt and wife. Very receptive. Will refer to [place], ACSM 06/28/2020 2:45 PM

## 2020-06-28 NOTE — Anesthesia Postprocedure Evaluation (Signed)
Anesthesia Post Note  Patient: Chinedum Vanhouten  Procedure(s) Performed: CORONARY ARTERY BYPASS GRAFTING (CABG) X3 USING LEFT INTERNAL MAMMARY ARTERY. RIGHT RADIAL ARTERY. RIGHT ENDOSCOPIC SAPHENOUS VEIN HARVESTING. (Chest) RIGHT RADIAL ARTERY HARVEST (Right: Arm Lower) TRANSESOPHAGEAL ECHOCARDIOGRAM (TEE) INDOCYANINE GREEN FLUORESCENCE IMAGING (ICG) ENDOVEIN HARVEST OF GREATER SAPHENOUS VEIN (Right: Leg Upper)     Patient location during evaluation: SICU Anesthesia Type: General Level of consciousness: sedated Pain management: pain level controlled Vital Signs Assessment: post-procedure vital signs reviewed and stable Respiratory status: patient remains intubated per anesthesia plan Cardiovascular status: stable Postop Assessment: no apparent nausea or vomiting Anesthetic complications: no   No notable events documented.  Last Vitals:  Vitals:   06/28/20 0527 06/28/20 0738  BP: (!) 174/81 (!) 146/65  Pulse: 93 86  Resp: 17 20  Temp: 37 C 36.8 C  SpO2: 99% 99%    Last Pain:  Vitals:   06/28/20 0825  TempSrc:   PainSc: 0-No pain                 Belenda Cruise P Dashanique Brownstein

## 2020-06-28 NOTE — Progress Notes (Addendum)
[address]       [place] [address]             [phone]        4 Days Post-Op Procedure(s) (LRB): CORONARY ARTERY BYPASS GRAFTING (CABG) X3 USING LEFT INTERNAL MAMMARY ARTERY. RIGHT RADIAL ARTERY. RIGHT ENDOSCOPIC SAPHENOUS VEIN HARVESTING. (N/A) RIGHT RADIAL ARTERY HARVEST (Right) TRANSESOPHAGEAL ECHOCARDIOGRAM (TEE) (N/A) INDOCYANINE GREEN FLUORESCENCE IMAGING (ICG) (N/A) ENDOVEIN HARVEST OF GREATER SAPHENOUS VEIN (Right)  Subjective: Patient had a bowel movement. He also states right thumb pain (radial artery harvest) is less so this am.  Objective: Vital signs in last 24 hours: Temp:  [98 F (36.7 C)-99.1 F (37.3 C)] 98.6 F (37 C) (06/28 0527) Pulse Rate:  [74-93] 93 (06/28 0527) Cardiac Rhythm: Normal sinus rhythm (06/27 1952) Resp:  [17-20] 17 (06/28 0527) BP: (145-174)/(68-84) 174/81 (06/28 0527) SpO2:  [94 %-100 %] 99 % (06/28 0527) Weight:  [83 kg] 83 kg (06/28 0527)  Pre op weight 83 kg Current Weight  06/28/20 83 kg      Intake/Output from previous day: 06/27 0701 - 06/28 0700 In: 480 [P.O.:480] Out: -    Physical Exam:  Cardiovascular: RRR, Pulmonary: Slightly diminished bibasilar breath sounds Abdomen: Soft, non tender, bowel sounds present. Extremities: Trace bilateral lower extremity edema. Right thumb pain, but motor/sensory intact Wounds: Sternal wound is clean and dry.  No erythema or signs of infection. RUE wound is clean and dry.  Lab Results: CBC: Recent Labs    06/26/20 0355 06/27/20 0136  WBC 15.3* 13.3*  HGB 10.1* 10.3*  HCT 30.4* 29.6*  PLT 108* 123*    BMET:  Recent Labs    06/27/20 0136 06/28/20 0052  NA 134* 139  K 4.1 4.1  CL 102 104  CO2 26 28  GLUCOSE 108* 111*  BUN 30* 28*  CREATININE 1.92* 1.97*  CALCIUM 7.5* 7.8*     PT/INR:  Lab Results  Component Value Date   INR 1.3 (H) 06/24/2020   INR 1.0 06/23/2020   ABG:  INR: Will add last result for INR, ABG once components  are confirmed Will add last 4 CBG results once components are confirmed  Assessment/Plan:  1. CV - Previous bradycardia, pacing. SR with HR in the 80's this am and more hypertensive. On Amlodipine 2.5 mg daily (as taken pre op) and started on Lopressor 12.5 mg bid yesterday. Will NOT restart Avapro as creatinine slightly increased. Will increase Amlodipine for better BP control. He later walked and HR into 120's. Lopressor was increased to 25 mg bid. Monitor HR, rhythm today. 2.  Pulmonary - On room air. Encourage incentive spirometer. 3. Volume Overload - He was given Lasix 40 mg IV 06/26 and with slight increase in creatinine, will give orally today and monitor creatinine 4. Expected blood loss anemia-Lst H and H stable at 10.3 and 29.6 5. Thrombocytopenia-platelets this am increased to 123,000 6. Chronic Kidney Disease   Stage I     GFR >90  Stage II    GFR 60-89  Stage IIIA GFR 45-59  Stage IIIB GFR 30-44  Stage IV   GFR 15-29  Stage V    GFR  <15  Lab Results  Component Value Date   CREATININE 1.97 (H) 06/28/2020   Estimated Creatinine Clearance: 32.9 mL/min (A) (by C-G formula based on SCr of 1.97 mg/dL (H)).-creatinine this am slightly increased to 1.97 (from 1.92). Appears his baseline is about 1.6-1.8. 7. If creatinine  decreased and HR,BP improved, will likely discharge in am  Ranyah Groeneveld M ZimmermanPA-C 06/28/2020,7:02 AM

## 2020-06-28 NOTE — Progress Notes (Signed)
CARDIAC REHAB PHASE I   PRE:  Rate/Rhythm: 82 SR  BP:  Sitting: 166/77      SaO2: 95 RA  MODE:  Ambulation: 470 ft   POST:  Rate/Rhythm: 120 ST w/ few PVCs  BP:  Sitting: 166/85      SaO2: 97 RA  Pt ambulated 470 ft independently. Pt had SOB after walk. Sats stable on RA. Pt went to recliner after walk. Encouraged pt to continue IS use. Will continue to follow up. [place], MS, ACSM-CEP 06/28/2020 8:27 AM

## 2020-06-29 LAB — BASIC METABOLIC PANEL
Anion gap: 6 (ref 5–15)
BUN: 30 mg/dL — ABNORMAL HIGH (ref 8–23)
CO2: 25 mmol/L (ref 22–32)
Calcium: 8 mg/dL — ABNORMAL LOW (ref 8.9–10.3)
Chloride: 107 mmol/L (ref 98–111)
Creatinine, Ser: 1.9 mg/dL — ABNORMAL HIGH (ref 0.61–1.24)
GFR, Estimated: 36 mL/min — ABNORMAL LOW (ref >60)
Glucose, Bld: 102 mg/dL — ABNORMAL HIGH (ref 70–99)
Potassium: 3.7 mmol/L (ref 3.5–5.1)
Sodium: 138 mmol/L (ref 135–145)

## 2020-06-29 MED ORDER — ATORVASTATIN CALCIUM 80 MG PO TABS: 80.0000 mg | ORAL_TABLET | Freq: Every day | ORAL | 1 refills | Status: AC

## 2020-06-29 MED ORDER — AMLODIPINE BESYLATE 10 MG PO TABS: 10.0000 mg | ORAL_TABLET | Freq: Every day | ORAL | 0 refills | Status: AC

## 2020-06-29 MED ORDER — ASPIRIN 325 MG PO TBEC: 325.0000 mg | DELAYED_RELEASE_TABLET | Freq: Every day | ORAL | 0 refills | Status: AC

## 2020-06-29 MED ORDER — IRBESARTAN 150 MG PO TABS
150.0000 mg | ORAL_TABLET | Freq: Every day | ORAL | Status: DC
  Filled 2020-06-29: qty 1

## 2020-06-29 MED ORDER — TRAMADOL HCL 50 MG PO TABS: 50.0000 mg | ORAL_TABLET | Freq: Four times a day (QID) | ORAL | 0 refills | Status: AC | PRN

## 2020-06-29 MED ORDER — IRBESARTAN 150 MG PO TABS: 150.0000 mg | ORAL_TABLET | Freq: Every day | ORAL | 1 refills | Status: AC

## 2020-06-29 MED ORDER — METOPROLOL TARTRATE 25 MG PO TABS: 25.0000 mg | ORAL_TABLET | Freq: Two times a day (BID) | ORAL | 1 refills | Status: DC

## 2020-06-29 MED ORDER — COLCHICINE 0.6 MG PO TABS: 0.3000 mg | ORAL_TABLET | Freq: Every day | ORAL | 0 refills | Status: DC

## 2020-06-29 NOTE — Plan of Care (Signed)
  Problem: Education: Goal: Will demonstrate proper wound care and an understanding of methods to prevent future damage Outcome: Adequate for Discharge Goal: Knowledge of disease or condition will improve Outcome: Adequate for Discharge Goal: Knowledge of the prescribed therapeutic regimen will improve Outcome: Adequate for Discharge Goal: Individualized Educational Video(s) Outcome: Adequate for Discharge   Problem: Activity: Goal: Risk for activity intolerance will decrease Outcome: Adequate for Discharge   Problem: Cardiac: Goal: Will achieve and/or maintain hemodynamic stability Outcome: Adequate for Discharge   Problem: Clinical Measurements: Goal: Postoperative complications will be avoided or minimized Outcome: Adequate for Discharge   Problem: Respiratory: Goal: Respiratory status will improve Outcome: Adequate for Discharge   Problem: Skin Integrity: Goal: Wound healing without signs and symptoms of infection Outcome: Adequate for Discharge Goal: Risk for impaired skin integrity will decrease Outcome: Adequate for Discharge   Problem: Urinary Elimination: Goal: Ability to achieve and maintain adequate renal perfusion and functioning will improve Outcome: Adequate for Discharge   Problem: Education: Goal: Knowledge of General Education information will improve Description: Including pain rating scale, medication(s)/side effects and non-pharmacologic comfort measures Outcome: Adequate for Discharge   Problem: Health Behavior/Discharge Planning: Goal: Ability to manage health-related needs will improve Outcome: Adequate for Discharge   Problem: Clinical Measurements: Goal: Ability to maintain clinical measurements within normal limits will improve Outcome: Adequate for Discharge Goal: Will remain free from infection Outcome: Adequate for Discharge Goal: Diagnostic test results will improve Outcome: Adequate for Discharge Goal: Respiratory complications will  improve Outcome: Adequate for Discharge Goal: Cardiovascular complication will be avoided Outcome: Adequate for Discharge   Problem: Activity: Goal: Risk for activity intolerance will decrease Outcome: Adequate for Discharge   Problem: Nutrition: Goal: Adequate nutrition will be maintained Outcome: Adequate for Discharge   Problem: Coping: Goal: Level of anxiety will decrease Outcome: Adequate for Discharge   Problem: Elimination: Goal: Will not experience complications related to bowel motility Outcome: Adequate for Discharge Goal: Will not experience complications related to urinary retention Outcome: Adequate for Discharge   Problem: Pain Managment: Goal: General experience of comfort will improve Outcome: Adequate for Discharge   Problem: Safety: Goal: Ability to remain free from injury will improve Outcome: Adequate for Discharge   Problem: Skin Integrity: Goal: Risk for impaired skin integrity will decrease Outcome: Adequate for Discharge   

## 2020-06-29 NOTE — Progress Notes (Addendum)
      [address]       [place] [address]             [phone]        5 Days Post-Op Procedure(s) (LRB): CORONARY ARTERY BYPASS GRAFTING (CABG) X3 USING LEFT INTERNAL MAMMARY ARTERY. RIGHT RADIAL ARTERY. RIGHT ENDOSCOPIC SAPHENOUS VEIN HARVESTING. (N/A) RIGHT RADIAL ARTERY HARVEST (Right) TRANSESOPHAGEAL ECHOCARDIOGRAM (TEE) (N/A) INDOCYANINE GREEN FLUORESCENCE IMAGING (ICG) (N/A) ENDOVEIN HARVEST OF GREATER SAPHENOUS VEIN (Right)  Subjective: He also states right thumb pain (radial artery harvest) is intermittent (he has this am;related to right radial artery harvest). He really wants to go home.  Objective: Vital signs in last 24 hours: Temp:  [98.2 F (36.8 C)-98.9 F (37.2 C)] 98.3 F (36.8 C) (06/29 0335) Pulse Rate:  [72-86] 77 (06/29 0335) Cardiac Rhythm: Normal sinus rhythm (06/28 1905) Resp:  [17-20] 18 (06/29 0335) BP: (130-158)/(65-79) 151/78 (06/29 0335) SpO2:  [97 %-99 %] 97 % (06/29 0335) Weight:  [82.3 kg] 82.3 kg (06/29 0455)  Pre op weight 83 kg Current Weight  06/29/20 82.3 kg      Intake/Output from previous day: 06/28 0701 - 06/29 0700 In: 120 [P.O.:120] Out: -    Physical Exam:  Cardiovascular: RRR, Pulmonary: Slightly diminished bibasilar breath sounds Abdomen: Soft, non tender, bowel sounds present. Extremities: No lower extremity edema. Right thumb pain, but motor/sensory intact Wounds: Sternal wound is clean and dry.  No erythema or signs of infection. RUE wound is clean and dry.  Lab Results: CBC: Recent Labs    06/27/20 0136  WBC 13.3*  HGB 10.3*  HCT 29.6*  PLT 123*    BMET:  Recent Labs    06/27/20 0136 06/28/20 0052  NA 134* 139  K 4.1 4.1  CL 102 104  CO2 26 28  GLUCOSE 108* 111*  BUN 30* 28*  CREATININE 1.92* 1.97*  CALCIUM 7.5* 7.8*     PT/INR:  Lab Results  Component Value Date   INR 1.3 (H) 06/24/2020   INR 1.0 06/23/2020   ABG:  INR: Will add last result for INR, ABG once  components are confirmed Will add last 4 CBG results once components are confirmed  Assessment/Plan:  1. CV - Previous bradycardia, pacing. SR with HR in the 70's this am and less hypertensive. On Amlodipine 10 mg daily (as taken pre op) and started on Lopressor 25 mg bid. After further consideration and need for more BP control, will restart low dose Avapro 2.  Pulmonary - On room air. Encourage incentive spirometer. 3. Volume Overload - He was given Lasix 40 mg IV 06/26 and with slight increase in creatinine. On 40 mg orally and monitor creatinine 4. Expected blood loss anemia-Last H and H stable at 10.3 and 29.6 5. Thrombocytopenia-platelets this am increased to 123,000 6. Chronic Kidney Disease   Stage I     GFR >90  Stage II    GFR 60-89  Stage IIIA GFR 45-59  Stage IIIB GFR 30-44  Stage IV   GFR 15-29  Stage V    GFR  <15  Lab Results  Component Value Date   CREATININE 1.97 (H) 06/28/2020   Estimated Creatinine Clearance: 32.8 mL/min (A) (by C-G formula based on SCr of 1.97 mg/dL (H)).-creatinine this am slightly decreased to 1.9 (from 1.97). Appears his baseline is about 1.6-1.8. 7. If creatinine decreased, will  discharge  Sonjia Wilcoxson M ZimmermanPA-C 06/29/2020,7:03 AM

## 2020-07-06 DIAGNOSIS — I10 Essential (primary) hypertension: Secondary | ICD-10-CM | POA: Diagnosis not present

## 2020-07-06 DIAGNOSIS — N1832 Chronic kidney disease, stage 3b: Secondary | ICD-10-CM | POA: Diagnosis not present

## 2020-07-06 DIAGNOSIS — Z951 Presence of aortocoronary bypass graft: Secondary | ICD-10-CM | POA: Diagnosis not present

## 2020-07-11 ENCOUNTER — Other Ambulatory Visit: Payer: Self-pay | Admitting: *Deleted

## 2020-07-11 DIAGNOSIS — Z951 Presence of aortocoronary bypass graft: Secondary | ICD-10-CM

## 2020-07-12 ENCOUNTER — Ambulatory Visit (INDEPENDENT_AMBULATORY_CARE_PROVIDER_SITE_OTHER): Payer: Self-pay | Admitting: Cardiothoracic Surgery

## 2020-07-12 ENCOUNTER — Other Ambulatory Visit: Payer: Self-pay

## 2020-07-12 ENCOUNTER — Ambulatory Visit
Admission: RE | Admit: 2020-07-12 | Discharge: 2020-07-12 | Disposition: A | Discharge status: Home / Self Care | Payer: Medicare HMO | Source: Ambulatory Visit | Attending: Cardiothoracic Surgery | Admitting: Cardiothoracic Surgery

## 2020-07-12 VITALS — BP 127/77 | HR 80 | Resp 20 | Ht 67.0 in | Wt 183.0 lb

## 2020-07-12 DIAGNOSIS — J90 Pleural effusion, not elsewhere classified: Secondary | ICD-10-CM | POA: Diagnosis not present

## 2020-07-12 DIAGNOSIS — I517 Cardiomegaly: Secondary | ICD-10-CM | POA: Diagnosis not present

## 2020-07-12 DIAGNOSIS — Z951 Presence of aortocoronary bypass graft: Secondary | ICD-10-CM

## 2020-07-12 MED ORDER — METOPROLOL TARTRATE 25 MG PO TABS: 12.5000 mg | ORAL_TABLET | Freq: Two times a day (BID) | ORAL | 1 refills | Status: AC

## 2020-07-14 ENCOUNTER — Ambulatory Visit: Payer: Medicare HMO | Admitting: Cardiothoracic Surgery

## 2020-07-14 ENCOUNTER — Telehealth (HOSPITAL_COMMUNITY): Payer: Self-pay

## 2020-07-14 NOTE — Telephone Encounter (Signed)
Recv'ed MD order in error, order was to be sent to [facility]. Will fax order to [facility].

## 2020-07-14 NOTE — Progress Notes (Signed)
      [address]       [place] [address]             [phone]     CARDIOTHORACIC SURGERY OFFICE NOTE  Referring Provider is Isaias Cowman, MD Primary Cardiologist is None PCP is Chrismon, Driscilla Grammes   HPI:  75 year old man status post CABG 06/24/2020.  He was discharged within 5 days.  He now returns for his initial postoperative visit.  He has no complaints of angina or shortness of breath.  He denies fevers or chills.  His appetite is reasonable.   Current Outpatient Medications  Medication Sig Dispense Refill   acetaminophen (TYLENOL) 500 MG tablet Take 500 mg by mouth every 6 (six) hours as needed for mild pain or moderate pain. As needed for mild pain.     amLODipine (NORVASC) 10 MG tablet Take 1 tablet (10 mg total) by mouth daily. 30 tablet 0   Ascorbic Acid (VITAMIN C) 1000 MG tablet Take 1,000 mg by mouth daily.     aspirin EC 325 MG EC tablet Take 1 tablet (325 mg total) by mouth daily. 30 tablet 0   atorvastatin (LIPITOR) 80 MG tablet Take 1 tablet (80 mg total) by mouth daily. 30 tablet 1   Cholecalciferol (DIALYVITE VITAMIN D 5000) 125 MCG (5000 UT) capsule Take 5,000 Units by mouth daily.     irbesartan (AVAPRO) 150 MG tablet Take 1 tablet (150 mg total) by mouth daily. 30 tablet 1   traMADol (ULTRAM) 50 MG tablet Take 1 tablet (50 mg total) by mouth every 6 (six) hours as needed for moderate pain. 28 tablet 0   zinc gluconate 50 MG tablet Take 50 mg by mouth daily.     metoprolol tartrate (LOPRESSOR) 25 MG tablet Take 0.5 tablets (12.5 mg total) by mouth 2 (two) times daily. 60 tablet 1   No current facility-administered medications for this visit.      Physical Exam:   BP 127/77   Pulse 80   Resp 20   Ht 5\' 7"  (1.702 m)   Wt 83 kg   SpO2 97% Comment: RA  BMI 28.66 kg/m   General:  Well-appearing no acute distress  Chest:   Clear to auscultation  CV:   Regular rate and rhythm  Incisions:  Healing well  Abdomen:  Soft  nontender  Extremities:  No edema  Diagnostic Tests:  Chest x-ray with clear lung fields and stable mediastinum   Impression:  Doing well after CABG  Plan:  Follow-up as needed Okay to drive  I spent in excess of 10 minutes during the conduct of this office consultation and >50% of this time involved direct face-to-face encounter with the patient for counseling and/or coordination of their care.  Level 2                 10 minutes Level 3                 15 minutes Level 4                 25 minutes Level 5                 40 minutes  B.  Murvin Natal, MD 07/14/2020 11:17 AM

## 2020-07-20 ENCOUNTER — Telehealth: Payer: Self-pay | Admitting: *Deleted

## 2020-07-20 NOTE — Chronic Care Management (AMB) (Signed)
  Chronic Care Management   Note  07/20/2020 Name: Jayesh Marbach MRN: [id number] DOB: [birth year]  Manford Sprong is a 75 y.o. year old male who is a primary care patient of Chrismon, Vickki Muff, PA-C. I reached out to Jeanmarie Plant by phone today in response to a referral sent by Mr. Kedrick Mcnamee PCP Chrismon, Vickki Muff, PA-C     Mr. Bachtel was given information about Chronic Care Management services today including:  CCM service includes personalized support from designated clinical staff supervised by his physician, including individualized plan of care and coordination with other care providers 24/7 contact phone numbers for assistance for urgent and routine care needs. Service will only be billed when office clinical staff spend 20 minutes or more in a month to coordinate care. Only one practitioner may furnish and bill the service in a calendar month. The patient may stop CCM services at any time (effective at the end of the month) by phone call to the office staff. The patient will be responsible for cost sharing (co-pay) of up to 20% of the service fee (after annual deductible is met).  Patient agreed to services and verbal consent obtained.   Follow up plan: Telephone appointment with care management team member scheduled for: 08/05/2020  Julian Hy, [place] [facility]  Direct Dial: [phone]

## 2020-07-26 ENCOUNTER — Other Ambulatory Visit: Payer: Self-pay | Admitting: Physician Assistant

## 2020-08-04 ENCOUNTER — Other Ambulatory Visit: Payer: Self-pay | Admitting: Physician Assistant

## 2020-08-05 ENCOUNTER — Ambulatory Visit (INDEPENDENT_AMBULATORY_CARE_PROVIDER_SITE_OTHER): Payer: Medicare HMO

## 2020-08-05 DIAGNOSIS — I25119 Atherosclerotic heart disease of native coronary artery with unspecified angina pectoris: Secondary | ICD-10-CM

## 2020-08-05 DIAGNOSIS — I10 Essential (primary) hypertension: Secondary | ICD-10-CM

## 2020-08-05 NOTE — Chronic Care Management (AMB) (Signed)
Chronic Care Management   CCM RN Visit Note  08/05/2020 Name: Kenneth Collins MRN: [id number] DOB: 30-Oct-1945  Subjective: Kenneth Collins is a 75 y.o. year old male who is a primary care patient of Chrismon, Vickki Muff, PA-C. The care management team was consulted for assistance with disease management and care coordination needs.    Engaged with patient by telephone for initial visit in response to provider referral for case management and care coordination services.   Consent to Services:  The patient was given the following information about Chronic Care Management services, agreed to services, and gave verbal consent: 1. CCM service includes personalized support from designated clinical staff supervised by the primary care provider, including individualized plan of care and coordination with other care providers 2. 24/7 contact phone numbers for assistance for urgent and routine care needs. 3. Service will only be billed when office clinical staff spend 20 minutes or more in a month to coordinate care. 4. Only one practitioner may furnish and bill the service in a calendar month. 5.The patient may stop CCM services at any time (effective at the end of the month) by phone call to the office staff. 6. The patient will be responsible for cost sharing (co-pay) of up to 20% of the service fee (after annual deductible is met). Patient agreed to services and consent obtained.   Assessment: Review of patient past medical history, allergies, medications, health status, including review of consultants reports, laboratory and other test data, was performed as part of comprehensive evaluation and provision of chronic care management services.   SDOH (Social Determinants of Health) assessments and interventions performed: Yes SDOH Interventions    Flowsheet Row Most Recent Value  SDOH Interventions   Food Insecurity Interventions Intervention Not Indicated  Transportation Interventions Intervention Not Indicated         CCM Care Plan  No Known Allergies  Outpatient Encounter Medications as of 08/05/2020  Medication Sig   acetaminophen (TYLENOL) 500 MG tablet Take 500 mg by mouth every 6 (six) hours as needed for mild pain or moderate pain. As needed for mild pain.   amLODipine (NORVASC) 10 MG tablet Take 1 tablet (10 mg total) by mouth daily.   Ascorbic Acid (VITAMIN C) 1000 MG tablet Take 1,000 mg by mouth daily.   aspirin EC 325 MG EC tablet Take 1 tablet (325 mg total) by mouth daily.   atorvastatin (LIPITOR) 80 MG tablet Take 1 tablet (80 mg total) by mouth daily.   Cholecalciferol (DIALYVITE VITAMIN D 5000) 125 MCG (5000 UT) capsule Take 5,000 Units by mouth daily.   irbesartan (AVAPRO) 150 MG tablet Take 1 tablet (150 mg total) by mouth daily.   metoprolol tartrate (LOPRESSOR) 25 MG tablet Take 0.5 tablets (12.5 mg total) by mouth 2 (two) times daily.   zinc gluconate 50 MG tablet Take 50 mg by mouth daily.   traMADol (ULTRAM) 50 MG tablet Take 1 tablet (50 mg total) by mouth every 6 (six) hours as needed for moderate pain. (Patient not taking: Reported on 08/05/2020)   No facility-administered encounter medications on file as of 08/05/2020.    Patient Active Problem List   Diagnosis Date Noted   S/P CABG x 3 06/24/2020   CAD (coronary artery disease) 06/21/2020   CKD (chronic kidney disease) stage 3, GFR 30-59 ml/min ([place]) 05/09/2017   Special screening for malignant neoplasms, colon    Ulceration of intestine    Chicken pox 04/16/2014   Displacement of lumbar intervertebral disc without  myelopathy 04/16/2014   Personal history of tobacco use, presenting hazards to health 04/16/2014   Received influenza vaccination at hospital 04/16/2014   LBP (low back pain) 04/16/2014   Need for vaccination 04/16/2014   Pneumococcal vaccination given 04/16/2014   Special screening for malignant neoplasm of prostate 04/16/2014   Screening for ischemic heart disease 04/16/2014   Basal cell papilloma  05/07/2008   Screening for lipoid disorders 05/07/2008   Acute conjunctivitis 02/05/2007   Acute upper respiratory infection 10/17/2006   Prostatitis 10/17/2006   Difficult or painful urination 10/08/2006   Febrile 10/08/2006   Routine general medical examination at a health care facility 10/22/2005   Blood in the urine 10/22/2005   Tobacco use 04/20/2005   Essential (primary) hypertension 10/20/2004    Conditions to be addressed/monitored:CAD, HTN, and HLD Patient Care Plan: CAD, HTN  and HLD     Problem Identified: CAD, HTN and HLD      Long-Range Goal: Disease Progression Prevented or Minimized   Start Date: 08/05/2020  Expected End Date: 12/03/2020  Priority: High  Note:   Objective:  Last practice recorded BP readings:  BP Readings from Last 3 Encounters:  07/12/20 127/77  06/29/20 117/69  06/23/20 (!) 153/72   Most recent eGFR/CrCl:  Lab Results  Component Value Date   EGFR 38 (L) 05/16/2020    No components found for: CRCL  Current Barriers:  Chronic Disease Management support and educational needs related to CAD, HTN and HLD.  Case Manager Clinical Goal(s):  Over the next 120 days, patient will demonstrate improved adherence to prescribed treatment plan as evidenced by taking all medications as prescribed, monitoring and recording blood pressure as directed, engaging in low impact exercise as tolerated and  adhering to low sodium/DASH diet.  Interventions:  Collaboration with Chrismon, Vickki Muff, PA-C regarding development and update of comprehensive plan of care as evidenced by provider attestation and co-signature Inter-disciplinary care team collaboration (see longitudinal plan of care) Evaluation of current treatment plan and patient's adherence to plan as established by provider. Reviewed medications and importance of compliance. Advised to continue taking medications as prescribed. Advised to notify the care management team with concerns regarding medication  management or prescription cost. Provided information regarding established blood pressure parameters along with indications for notifying a provider. Encouraged to monitor and record readings. Reports recent readings have been within range. Discussed activity tolerance. Reports consistently engaging in low impact exercises for the last three weeks. Currently uses a stationary bike and treadmill. Discussed compliance with recommended cardiac prudent diet. Encouraged to read nutrition labels, monitor sodium intake and avoid highly processed foods when possible. Reviewed s/sx of heart attack, stroke and worsening symptoms that require immediate medical attention. Reviewed pending appointments. Advised to attend all medication appointments as scheduled. Advised to notify the care management team with concerns regarding transportation.   Patient Goals/Self-Care Activities: Self-administer medications as prescribed Monitor and record blood pressure Adhere to recommended cardiac prudent/heart healthy diet Engage in low impact exercises as tolerated Notify provider or care management team with questions and new concerns as needed    Follow Up Plan:  Will follow up in two months      PLAN A member of the care management team will follow up in two months.   Cristy [facility]lander [facility] Care Management [facility] [phone]

## 2020-08-05 NOTE — Patient Instructions (Addendum)
Thank you for allowing the Chronic Care Management team to participate in your care.   Goal Addressed: Patient Care Plan: CAD, HTN  and HLD     Problem Identified: CAD, HTN and HLD      Long-Range Goal: Disease Progression Prevented or Minimized   Start Date: 08/05/2020  Expected End Date: 12/03/2020  Priority: High  Note:   Objective:  Last practice recorded BP readings:  BP Readings from Last 3 Encounters:  07/12/20 127/77  06/29/20 117/69  06/23/20 (!) 153/72   Most recent eGFR/CrCl:  Lab Results  Component Value Date   EGFR 38 (L) 05/16/2020    No components found for: CRCL  Current Barriers:  Chronic Disease Management support and educational needs related to CAD, HTN and HLD.  Case Manager Clinical Goal(s):  Over the next 120 days, patient will demonstrate improved adherence to prescribed treatment plan as evidenced by taking all medications as prescribed, monitoring and recording blood pressure as directed, engaging in low impact exercise as tolerated and  adhering to low sodium/DASH diet.  Interventions:  Collaboration with Chrismon, Vickki Muff, PA-C regarding development and update of comprehensive plan of care as evidenced by provider attestation and co-signature Inter-disciplinary care team collaboration (see longitudinal plan of care) Evaluation of current treatment plan and patient's adherence to plan as established by provider. Reviewed medications and importance of compliance. Advised to continue taking medications as prescribed. Advised to notify the care management team with concerns regarding medication management or prescription cost. Provided information regarding established blood pressure parameters along with indications for notifying a provider. Encouraged to monitor and record readings. Reports recent readings have been within range. Discussed activity tolerance. Reports consistently engaging in low impact exercises for the last three weeks. Currently uses a  stationary bike and treadmill. Discussed compliance with recommended cardiac prudent diet. Encouraged to read nutrition labels, monitor sodium intake and avoid highly processed foods when possible. Reviewed s/sx of heart attack, stroke and worsening symptoms that require immediate medical attention. Reviewed pending appointments. Advised to attend all medication appointments as scheduled. Advised to notify the care management team with concerns regarding transportation.   Patient Goals/Self-Care Activities: Self-administer medications as prescribed Monitor and record blood pressure Adhere to recommended cardiac prudent/heart healthy diet Engage in low impact exercises as tolerated Notify provider or care management team with questions and new concerns as needed    Follow Up Plan:  Will follow up in two months      PLAN Mr. Petitjean verbalized understanding of the information discussed during the telephonic outreach. Declined need for mailed/printed instructions. A member of the care management team will follow up in two months.   Cristy [facility]lander [facility] Care Management [facility] [phone]

## 2020-08-09 DIAGNOSIS — R809 Proteinuria, unspecified: Secondary | ICD-10-CM | POA: Diagnosis not present

## 2020-08-09 DIAGNOSIS — N2581 Secondary hyperparathyroidism of renal origin: Secondary | ICD-10-CM | POA: Diagnosis not present

## 2020-08-09 DIAGNOSIS — N1832 Chronic kidney disease, stage 3b: Secondary | ICD-10-CM | POA: Diagnosis not present

## 2020-08-09 DIAGNOSIS — I129 Hypertensive chronic kidney disease with stage 1 through stage 4 chronic kidney disease, or unspecified chronic kidney disease: Secondary | ICD-10-CM | POA: Diagnosis not present

## 2020-08-09 DIAGNOSIS — R609 Edema, unspecified: Secondary | ICD-10-CM | POA: Diagnosis not present

## 2020-08-17 DIAGNOSIS — I129 Hypertensive chronic kidney disease with stage 1 through stage 4 chronic kidney disease, or unspecified chronic kidney disease: Secondary | ICD-10-CM | POA: Diagnosis not present

## 2020-08-17 DIAGNOSIS — R609 Edema, unspecified: Secondary | ICD-10-CM | POA: Diagnosis not present

## 2020-08-17 DIAGNOSIS — R809 Proteinuria, unspecified: Secondary | ICD-10-CM | POA: Diagnosis not present

## 2020-08-17 DIAGNOSIS — N2581 Secondary hyperparathyroidism of renal origin: Secondary | ICD-10-CM | POA: Diagnosis not present

## 2020-08-17 DIAGNOSIS — N1832 Chronic kidney disease, stage 3b: Secondary | ICD-10-CM | POA: Diagnosis not present

## 2020-08-24 ENCOUNTER — Other Ambulatory Visit: Payer: Self-pay | Admitting: Physician Assistant

## 2020-08-31 DIAGNOSIS — I25119 Atherosclerotic heart disease of native coronary artery with unspecified angina pectoris: Secondary | ICD-10-CM | POA: Diagnosis not present

## 2020-08-31 DIAGNOSIS — I10 Essential (primary) hypertension: Secondary | ICD-10-CM | POA: Diagnosis not present

## 2020-10-06 DIAGNOSIS — I10 Essential (primary) hypertension: Secondary | ICD-10-CM | POA: Diagnosis not present

## 2020-10-06 DIAGNOSIS — Z951 Presence of aortocoronary bypass graft: Secondary | ICD-10-CM | POA: Diagnosis not present

## 2020-10-06 DIAGNOSIS — R0602 Shortness of breath: Secondary | ICD-10-CM | POA: Diagnosis not present

## 2020-10-06 DIAGNOSIS — N1832 Chronic kidney disease, stage 3b: Secondary | ICD-10-CM | POA: Diagnosis not present

## 2020-10-06 DIAGNOSIS — Z23 Encounter for immunization: Secondary | ICD-10-CM | POA: Diagnosis not present

## 2020-10-14 ENCOUNTER — Ambulatory Visit (INDEPENDENT_AMBULATORY_CARE_PROVIDER_SITE_OTHER): Payer: Medicare HMO

## 2020-10-14 DIAGNOSIS — I25119 Atherosclerotic heart disease of native coronary artery with unspecified angina pectoris: Secondary | ICD-10-CM

## 2020-10-14 DIAGNOSIS — I10 Essential (primary) hypertension: Secondary | ICD-10-CM

## 2020-10-14 NOTE — Chronic Care Management (AMB) (Signed)
Chronic Care Management   CCM RN Visit Note  10/14/2020 Name: Kenneth Collins MRN: [id number] DOB: 14-Jul-1945  Subjective: Kenneth Collins is a 75 y.o. year old male who is a primary care patient of Chrismon, Vickki Muff, PA-C (Inactive). The care management team was consulted for assistance with disease management and care coordination needs.    Engaged with patient by telephone for follow up visit in response to provider referral for case management and care coordination services.   Consent to Services:  The patient was given information about Chronic Care Management services, agreed to services, and gave verbal consent prior to initiation of services.  Please see initial visit note for detailed documentation.   Assessment: Review of patient past medical history, allergies, medications, health status, including review of consultants reports, laboratory and other test data, was performed as part of comprehensive evaluation and provision of chronic care management services.   SDOH (Social Determinants of Health) assessments and interventions performed:    CCM Care Plan  No Known Allergies  Outpatient Encounter Medications as of 10/14/2020  Medication Sig   acetaminophen (TYLENOL) 500 MG tablet Take 500 mg by mouth every 6 (six) hours as needed for mild pain or moderate pain. As needed for mild pain.   amLODipine (NORVASC) 10 MG tablet Take 1 tablet (10 mg total) by mouth daily.   Ascorbic Acid (VITAMIN C) 1000 MG tablet Take 1,000 mg by mouth daily.   aspirin EC 325 MG EC tablet Take 1 tablet (325 mg total) by mouth daily.   atorvastatin (LIPITOR) 80 MG tablet Take 1 tablet (80 mg total) by mouth daily.   Cholecalciferol (DIALYVITE VITAMIN D 5000) 125 MCG (5000 UT) capsule Take 5,000 Units by mouth daily.   irbesartan (AVAPRO) 150 MG tablet Take 1 tablet (150 mg total) by mouth daily.   metoprolol tartrate (LOPRESSOR) 25 MG tablet Take 0.5 tablets (12.5 mg total) by mouth 2 (two) times daily.    traMADol (ULTRAM) 50 MG tablet Take 1 tablet (50 mg total) by mouth every 6 (six) hours as needed for moderate pain. (Patient not taking: No sig reported)   zinc gluconate 50 MG tablet Take 50 mg by mouth daily.   No facility-administered encounter medications on file as of 10/14/2020.    Patient Active Problem List   Diagnosis Date Noted   S/P CABG x 3 06/24/2020   CAD (coronary artery disease) 06/21/2020   CKD (chronic kidney disease) stage 3, GFR 30-59 ml/min ([place]) 05/09/2017   Special screening for malignant neoplasms, colon    Ulceration of intestine    Chicken pox 04/16/2014   Displacement of lumbar intervertebral disc without myelopathy 04/16/2014   Personal history of tobacco use, presenting hazards to health 04/16/2014   Received influenza vaccination at hospital 04/16/2014   LBP (low back pain) 04/16/2014   Need for vaccination 04/16/2014   Pneumococcal vaccination given 04/16/2014   Special screening for malignant neoplasm of prostate 04/16/2014   Screening for ischemic heart disease 04/16/2014   Basal cell papilloma 05/07/2008   Screening for lipoid disorders 05/07/2008   Acute conjunctivitis 02/05/2007   Acute upper respiratory infection 10/17/2006   Prostatitis 10/17/2006   Difficult or painful urination 10/08/2006   Febrile 10/08/2006   Routine general medical examination at a health care facility 10/22/2005   Blood in the urine 10/22/2005   Tobacco use 04/20/2005   Essential (primary) hypertension 10/20/2004    Conditions to be addressed/monitored:CAD, HTN, and HLD Patient Care Plan: CAD, HTN  and  HLD     Problem Identified: CAD, HTN and HLD      Long-Range Goal: Disease Progression Prevented or Minimized Completed 10/14/2020  Start Date: 08/05/2020  Expected End Date: 12/03/2020  Priority: High  Note:    Current Barriers:  Chronic Disease Management support and educational needs related to CAD, HTN and HLD.  Case Manager Clinical Goal(s):  Over the  next 120 days, patient will demonstrate improved adherence to prescribed treatment plan as evidenced by taking all medications as prescribed, monitoring and recording blood pressure as directed, engaging in low impact exercise as tolerated and  adhering to low sodium/DASH diet.  Interventions:  Collaboration with Chrismon, Vickki Muff, PA-C regarding development and update of comprehensive plan of care as evidenced by provider attestation and co-signature Inter-disciplinary care team collaboration (see longitudinal plan of care) Reviewed medications and compliance with current treatment plan. Reports excellent compliance with medications. Currently taking baby aspirin daily as advised. No concerns regarding medication management or prescription cost. Reviewed established blood pressure parameters and discussed home readings. Reports home readings have been within range. Advised to continue monitoring and recording readings.  Discussed nutritional intake and activity engagement. Remains compliant with diet and activity recommendations. Remains very motivated to improve his overall health.  Reviewed s/sx of heart attack, stroke and worsening symptoms that require immediate medical attention.   Patient Goals/Self-Care Activities: Self-administer medications as prescribed Complete medical appointments as scheduled Monitor and record blood pressure Adhere to recommended cardiac prudent/heart healthy diet Engage in low impact exercises as tolerated         PLAN: [name] remains complaint with medications and treatment recommendations. His primary care provider is no longer at the clinic. He anticipates transferring care to [facility] clinic. Declines current need for additional care management services. Agreed to call if outreach is required prior to transferring care. The care management team will gladly assist.   Cristy [facility]lander [facility] Care Management [facility] [phone]

## 2020-10-14 NOTE — Patient Instructions (Addendum)
Thank you for allowing the Chronic Care Management team to participate in your care.    Patient Care Plan: CAD, HTN  and HLD     Problem Identified: CAD, HTN and HLD      Long-Range Goal: Disease Progression Prevented or Minimized Completed 10/14/2020  Start Date: 08/05/2020  Expected End Date: 12/03/2020  Priority: High  Note:    Current Barriers:  Chronic Disease Management support and educational needs related to CAD, HTN and HLD.  Case Manager Clinical Goal(s):  Over the next 120 days, patient will demonstrate improved adherence to prescribed treatment plan as evidenced by taking all medications as prescribed, monitoring and recording blood pressure as directed, engaging in low impact exercise as tolerated and  adhering to low sodium/DASH diet.  Interventions:  Collaboration with Chrismon, Vickki Muff, PA-C regarding development and update of comprehensive plan of care as evidenced by provider attestation and co-signature Inter-disciplinary care team collaboration (see longitudinal plan of care) Reviewed medications and compliance with current treatment plan. Reports excellent compliance with medications. Currently taking baby aspirin daily as advised. No concerns regarding medication management or prescription cost. Reviewed established blood pressure parameters and discussed home readings. Reports home readings have been within range. Advised to continue monitoring and recording readings.  Discussed nutritional intake and activity engagement. Remains compliant with diet and activity recommendations. Remains very motivated to improve his overall health.  Reviewed s/sx of heart attack, stroke and worsening symptoms that require immediate medical attention.   Patient Goals/Self-Care Activities: Self-administer medications as prescribed Complete medical appointments as scheduled Monitor and record blood pressure Adhere to recommended cardiac prudent/heart healthy diet Engage in low  impact exercises as tolerated          Mr. Fiumara verbalized understanding of the information discussed during the telephonic outreach. Declined need for mailed/printed instructions.  Agreed to call if outreach is required prior to transferring care to [place] clinic. The care management team will gladly assist.   Cristy [facility]lander [facility] Care Management [facility] [phone]

## 2020-10-20 DIAGNOSIS — Z7722 Contact with and (suspected) exposure to environmental tobacco smoke (acute) (chronic): Secondary | ICD-10-CM | POA: Diagnosis not present

## 2020-10-20 DIAGNOSIS — E785 Hyperlipidemia, unspecified: Secondary | ICD-10-CM | POA: Diagnosis not present

## 2020-10-20 DIAGNOSIS — Z6830 Body mass index (BMI) 30.0-30.9, adult: Secondary | ICD-10-CM | POA: Diagnosis not present

## 2020-10-20 DIAGNOSIS — E669 Obesity, unspecified: Secondary | ICD-10-CM | POA: Diagnosis not present

## 2020-10-20 DIAGNOSIS — Z8249 Family history of ischemic heart disease and other diseases of the circulatory system: Secondary | ICD-10-CM | POA: Diagnosis not present

## 2020-10-20 DIAGNOSIS — I25119 Atherosclerotic heart disease of native coronary artery with unspecified angina pectoris: Secondary | ICD-10-CM | POA: Diagnosis not present

## 2020-10-20 DIAGNOSIS — Z85820 Personal history of malignant melanoma of skin: Secondary | ICD-10-CM | POA: Diagnosis not present

## 2020-10-20 DIAGNOSIS — K5900 Constipation, unspecified: Secondary | ICD-10-CM | POA: Diagnosis not present

## 2020-10-20 DIAGNOSIS — I10 Essential (primary) hypertension: Secondary | ICD-10-CM | POA: Diagnosis not present

## 2020-10-20 DIAGNOSIS — Z7982 Long term (current) use of aspirin: Secondary | ICD-10-CM | POA: Diagnosis not present

## 2020-10-25 ENCOUNTER — Telehealth: Payer: Self-pay

## 2020-10-25 NOTE — Telephone Encounter (Signed)
Copied from [place] [phone]. Topic: Medical Record Request - Patient ROI Request >> Oct 25, 2020  3:27 PM Tessa Lerner A wrote: Patient Name/DOB/MRN #: Shayon Trompeter /  Nov 11, 1945 / [id number] Requestor Name/Agency: Dr. Geralyn Corwin / [place]  Call Back #: [phone] Information Requested: All records    Route to [facility] for [place] clinics. For all other clinics, route to the clinic's PEC Pool.

## 2020-10-25 NOTE — Telephone Encounter (Signed)
Copied from [place] [id number]. Topic: Medical Record Request - Patient ROI Request >> Oct 25, 2020  3:27 PM Tessa Lerner A wrote: Patient Name/DOB/MRN #: Kenneth Collins /  January 21, 1945 / [id number] Requestor Name/Agency: Dr. Geralyn Corwin / [place]  Call Back #: [phone] Information Requested: All records    Route to [facility] for [place] clinics. For all other clinics, route to the clinic's PEC Pool.

## 2020-10-28 NOTE — Telephone Encounter (Signed)
Pt advised need signed ROI. TNP

## 2020-10-31 DIAGNOSIS — I10 Essential (primary) hypertension: Secondary | ICD-10-CM | POA: Diagnosis not present

## 2020-10-31 DIAGNOSIS — I25119 Atherosclerotic heart disease of native coronary artery with unspecified angina pectoris: Secondary | ICD-10-CM

## 2020-11-07 DIAGNOSIS — N1832 Chronic kidney disease, stage 3b: Secondary | ICD-10-CM | POA: Diagnosis not present

## 2020-11-07 DIAGNOSIS — N2581 Secondary hyperparathyroidism of renal origin: Secondary | ICD-10-CM | POA: Diagnosis not present

## 2020-11-07 DIAGNOSIS — R609 Edema, unspecified: Secondary | ICD-10-CM | POA: Diagnosis not present

## 2020-11-07 DIAGNOSIS — I129 Hypertensive chronic kidney disease with stage 1 through stage 4 chronic kidney disease, or unspecified chronic kidney disease: Secondary | ICD-10-CM | POA: Diagnosis not present

## 2020-11-07 DIAGNOSIS — R809 Proteinuria, unspecified: Secondary | ICD-10-CM | POA: Diagnosis not present

## 2020-11-09 DIAGNOSIS — R809 Proteinuria, unspecified: Secondary | ICD-10-CM | POA: Diagnosis not present

## 2020-11-09 DIAGNOSIS — N2581 Secondary hyperparathyroidism of renal origin: Secondary | ICD-10-CM | POA: Diagnosis not present

## 2020-11-09 DIAGNOSIS — I129 Hypertensive chronic kidney disease with stage 1 through stage 4 chronic kidney disease, or unspecified chronic kidney disease: Secondary | ICD-10-CM | POA: Diagnosis not present

## 2020-11-09 DIAGNOSIS — N1832 Chronic kidney disease, stage 3b: Secondary | ICD-10-CM | POA: Diagnosis not present

## 2020-11-09 DIAGNOSIS — R609 Edema, unspecified: Secondary | ICD-10-CM | POA: Diagnosis not present

## 2021-02-15 DIAGNOSIS — I129 Hypertensive chronic kidney disease with stage 1 through stage 4 chronic kidney disease, or unspecified chronic kidney disease: Secondary | ICD-10-CM | POA: Diagnosis not present

## 2021-02-15 DIAGNOSIS — N2581 Secondary hyperparathyroidism of renal origin: Secondary | ICD-10-CM | POA: Diagnosis not present

## 2021-02-15 DIAGNOSIS — R609 Edema, unspecified: Secondary | ICD-10-CM | POA: Diagnosis not present

## 2021-02-15 DIAGNOSIS — R809 Proteinuria, unspecified: Secondary | ICD-10-CM | POA: Diagnosis not present

## 2021-02-15 DIAGNOSIS — N1832 Chronic kidney disease, stage 3b: Secondary | ICD-10-CM | POA: Diagnosis not present

## 2021-02-20 DIAGNOSIS — I129 Hypertensive chronic kidney disease with stage 1 through stage 4 chronic kidney disease, or unspecified chronic kidney disease: Secondary | ICD-10-CM | POA: Diagnosis not present

## 2021-02-20 DIAGNOSIS — Z1331 Encounter for screening for depression: Secondary | ICD-10-CM | POA: Diagnosis not present

## 2021-02-20 DIAGNOSIS — N1832 Chronic kidney disease, stage 3b: Secondary | ICD-10-CM | POA: Diagnosis not present

## 2021-02-20 DIAGNOSIS — N2581 Secondary hyperparathyroidism of renal origin: Secondary | ICD-10-CM | POA: Diagnosis not present

## 2021-02-20 DIAGNOSIS — Z87891 Personal history of nicotine dependence: Secondary | ICD-10-CM | POA: Diagnosis not present

## 2021-02-20 DIAGNOSIS — Z0000 Encounter for general adult medical examination without abnormal findings: Secondary | ICD-10-CM | POA: Diagnosis not present

## 2021-02-20 DIAGNOSIS — Z951 Presence of aortocoronary bypass graft: Secondary | ICD-10-CM | POA: Diagnosis not present

## 2021-02-21 DIAGNOSIS — I10 Essential (primary) hypertension: Secondary | ICD-10-CM | POA: Diagnosis not present

## 2021-02-21 DIAGNOSIS — Z951 Presence of aortocoronary bypass graft: Secondary | ICD-10-CM | POA: Diagnosis not present

## 2021-02-21 DIAGNOSIS — Z23 Encounter for immunization: Secondary | ICD-10-CM | POA: Diagnosis not present

## 2021-02-23 DIAGNOSIS — R609 Edema, unspecified: Secondary | ICD-10-CM | POA: Diagnosis not present

## 2021-02-23 DIAGNOSIS — E875 Hyperkalemia: Secondary | ICD-10-CM | POA: Diagnosis not present

## 2021-02-23 DIAGNOSIS — R809 Proteinuria, unspecified: Secondary | ICD-10-CM | POA: Diagnosis not present

## 2021-02-23 DIAGNOSIS — N2581 Secondary hyperparathyroidism of renal origin: Secondary | ICD-10-CM | POA: Diagnosis not present

## 2021-02-23 DIAGNOSIS — I129 Hypertensive chronic kidney disease with stage 1 through stage 4 chronic kidney disease, or unspecified chronic kidney disease: Secondary | ICD-10-CM | POA: Diagnosis not present

## 2021-02-23 DIAGNOSIS — N1832 Chronic kidney disease, stage 3b: Secondary | ICD-10-CM | POA: Diagnosis not present

## 2021-03-07 DIAGNOSIS — I10 Essential (primary) hypertension: Secondary | ICD-10-CM | POA: Diagnosis not present

## 2021-03-07 DIAGNOSIS — Z951 Presence of aortocoronary bypass graft: Secondary | ICD-10-CM | POA: Diagnosis not present

## 2021-03-07 DIAGNOSIS — Z125 Encounter for screening for malignant neoplasm of prostate: Secondary | ICD-10-CM | POA: Diagnosis not present

## 2021-03-07 DIAGNOSIS — N1832 Chronic kidney disease, stage 3b: Secondary | ICD-10-CM | POA: Diagnosis not present

## 2021-03-07 DIAGNOSIS — Z0000 Encounter for general adult medical examination without abnormal findings: Secondary | ICD-10-CM | POA: Diagnosis not present

## 2021-03-07 DIAGNOSIS — N2581 Secondary hyperparathyroidism of renal origin: Secondary | ICD-10-CM | POA: Diagnosis not present

## 2021-04-11 DIAGNOSIS — N1832 Chronic kidney disease, stage 3b: Secondary | ICD-10-CM | POA: Diagnosis not present

## 2021-04-11 DIAGNOSIS — R609 Edema, unspecified: Secondary | ICD-10-CM | POA: Diagnosis not present

## 2021-04-11 DIAGNOSIS — I129 Hypertensive chronic kidney disease with stage 1 through stage 4 chronic kidney disease, or unspecified chronic kidney disease: Secondary | ICD-10-CM | POA: Diagnosis not present

## 2021-04-11 DIAGNOSIS — N2581 Secondary hyperparathyroidism of renal origin: Secondary | ICD-10-CM | POA: Diagnosis not present

## 2021-04-11 DIAGNOSIS — R809 Proteinuria, unspecified: Secondary | ICD-10-CM | POA: Diagnosis not present

## 2021-04-19 DIAGNOSIS — I10 Essential (primary) hypertension: Secondary | ICD-10-CM | POA: Diagnosis not present

## 2021-04-19 DIAGNOSIS — N184 Chronic kidney disease, stage 4 (severe): Secondary | ICD-10-CM | POA: Diagnosis not present

## 2021-04-19 DIAGNOSIS — R600 Localized edema: Secondary | ICD-10-CM | POA: Diagnosis not present

## 2021-06-21 DIAGNOSIS — I2510 Atherosclerotic heart disease of native coronary artery without angina pectoris: Secondary | ICD-10-CM | POA: Diagnosis not present

## 2021-06-21 DIAGNOSIS — I10 Essential (primary) hypertension: Secondary | ICD-10-CM | POA: Diagnosis not present

## 2021-06-21 DIAGNOSIS — N1832 Chronic kidney disease, stage 3b: Secondary | ICD-10-CM | POA: Diagnosis not present

## 2021-06-21 DIAGNOSIS — Z951 Presence of aortocoronary bypass graft: Secondary | ICD-10-CM | POA: Diagnosis not present

## 2021-08-22 DIAGNOSIS — N1832 Chronic kidney disease, stage 3b: Secondary | ICD-10-CM | POA: Diagnosis not present

## 2021-08-22 DIAGNOSIS — Z951 Presence of aortocoronary bypass graft: Secondary | ICD-10-CM | POA: Diagnosis not present

## 2021-08-22 DIAGNOSIS — I129 Hypertensive chronic kidney disease with stage 1 through stage 4 chronic kidney disease, or unspecified chronic kidney disease: Secondary | ICD-10-CM | POA: Diagnosis not present

## 2021-08-22 DIAGNOSIS — Z87891 Personal history of nicotine dependence: Secondary | ICD-10-CM | POA: Diagnosis not present

## 2021-08-22 DIAGNOSIS — N2581 Secondary hyperparathyroidism of renal origin: Secondary | ICD-10-CM | POA: Diagnosis not present

## 2021-08-22 DIAGNOSIS — I2510 Atherosclerotic heart disease of native coronary artery without angina pectoris: Secondary | ICD-10-CM | POA: Diagnosis not present

## 2021-08-29 DIAGNOSIS — N1832 Chronic kidney disease, stage 3b: Secondary | ICD-10-CM | POA: Diagnosis not present

## 2021-08-29 DIAGNOSIS — I10 Essential (primary) hypertension: Secondary | ICD-10-CM | POA: Diagnosis not present

## 2021-08-29 DIAGNOSIS — N2581 Secondary hyperparathyroidism of renal origin: Secondary | ICD-10-CM | POA: Diagnosis not present

## 2021-08-31 DIAGNOSIS — R600 Localized edema: Secondary | ICD-10-CM | POA: Diagnosis not present

## 2021-08-31 DIAGNOSIS — N184 Chronic kidney disease, stage 4 (severe): Secondary | ICD-10-CM | POA: Diagnosis not present

## 2021-08-31 DIAGNOSIS — I10 Essential (primary) hypertension: Secondary | ICD-10-CM | POA: Diagnosis not present

## 2021-09-12 DIAGNOSIS — N2581 Secondary hyperparathyroidism of renal origin: Secondary | ICD-10-CM | POA: Diagnosis not present

## 2021-09-12 DIAGNOSIS — N184 Chronic kidney disease, stage 4 (severe): Secondary | ICD-10-CM | POA: Diagnosis not present

## 2021-09-12 DIAGNOSIS — R600 Localized edema: Secondary | ICD-10-CM | POA: Diagnosis not present

## 2021-09-12 DIAGNOSIS — I129 Hypertensive chronic kidney disease with stage 1 through stage 4 chronic kidney disease, or unspecified chronic kidney disease: Secondary | ICD-10-CM | POA: Diagnosis not present

## 2021-09-12 DIAGNOSIS — R809 Proteinuria, unspecified: Secondary | ICD-10-CM | POA: Diagnosis not present

## 2021-11-16 DIAGNOSIS — H43813 Vitreous degeneration, bilateral: Secondary | ICD-10-CM | POA: Diagnosis not present

## 2021-12-13 DIAGNOSIS — I129 Hypertensive chronic kidney disease with stage 1 through stage 4 chronic kidney disease, or unspecified chronic kidney disease: Secondary | ICD-10-CM | POA: Diagnosis not present

## 2021-12-13 DIAGNOSIS — R809 Proteinuria, unspecified: Secondary | ICD-10-CM | POA: Diagnosis not present

## 2021-12-13 DIAGNOSIS — N2581 Secondary hyperparathyroidism of renal origin: Secondary | ICD-10-CM | POA: Diagnosis not present

## 2021-12-13 DIAGNOSIS — N184 Chronic kidney disease, stage 4 (severe): Secondary | ICD-10-CM | POA: Diagnosis not present

## 2021-12-21 DIAGNOSIS — Z951 Presence of aortocoronary bypass graft: Secondary | ICD-10-CM | POA: Diagnosis not present

## 2021-12-21 DIAGNOSIS — I10 Essential (primary) hypertension: Secondary | ICD-10-CM | POA: Diagnosis not present

## 2021-12-21 DIAGNOSIS — I2510 Atherosclerotic heart disease of native coronary artery without angina pectoris: Secondary | ICD-10-CM | POA: Diagnosis not present

## 2021-12-21 DIAGNOSIS — R609 Edema, unspecified: Secondary | ICD-10-CM | POA: Diagnosis not present

## 2021-12-21 DIAGNOSIS — I129 Hypertensive chronic kidney disease with stage 1 through stage 4 chronic kidney disease, or unspecified chronic kidney disease: Secondary | ICD-10-CM | POA: Diagnosis not present

## 2022-01-01 DIAGNOSIS — R69 Illness, unspecified: Secondary | ICD-10-CM | POA: Diagnosis not present

## 2022-01-10 DIAGNOSIS — I129 Hypertensive chronic kidney disease with stage 1 through stage 4 chronic kidney disease, or unspecified chronic kidney disease: Secondary | ICD-10-CM | POA: Diagnosis not present

## 2022-01-10 DIAGNOSIS — R809 Proteinuria, unspecified: Secondary | ICD-10-CM | POA: Diagnosis not present

## 2022-01-10 DIAGNOSIS — N184 Chronic kidney disease, stage 4 (severe): Secondary | ICD-10-CM | POA: Diagnosis not present

## 2022-01-10 DIAGNOSIS — R600 Localized edema: Secondary | ICD-10-CM | POA: Diagnosis not present

## 2022-01-10 DIAGNOSIS — N2581 Secondary hyperparathyroidism of renal origin: Secondary | ICD-10-CM | POA: Diagnosis not present

## 2022-01-11 DIAGNOSIS — H903 Sensorineural hearing loss, bilateral: Secondary | ICD-10-CM | POA: Diagnosis not present

## 2022-01-12 DIAGNOSIS — H903 Sensorineural hearing loss, bilateral: Secondary | ICD-10-CM | POA: Diagnosis not present

## 2022-01-25 DIAGNOSIS — H903 Sensorineural hearing loss, bilateral: Secondary | ICD-10-CM | POA: Diagnosis not present

## 2022-02-07 DIAGNOSIS — I10 Essential (primary) hypertension: Secondary | ICD-10-CM | POA: Diagnosis not present

## 2022-02-07 DIAGNOSIS — Z951 Presence of aortocoronary bypass graft: Secondary | ICD-10-CM | POA: Diagnosis not present

## 2022-02-07 DIAGNOSIS — I2510 Atherosclerotic heart disease of native coronary artery without angina pectoris: Secondary | ICD-10-CM | POA: Diagnosis not present

## 2022-02-07 DIAGNOSIS — N1832 Chronic kidney disease, stage 3b: Secondary | ICD-10-CM | POA: Diagnosis not present

## 2022-02-07 DIAGNOSIS — N2581 Secondary hyperparathyroidism of renal origin: Secondary | ICD-10-CM | POA: Diagnosis not present

## 2022-02-07 DIAGNOSIS — Z87891 Personal history of nicotine dependence: Secondary | ICD-10-CM | POA: Diagnosis not present

## 2022-02-07 DIAGNOSIS — Z125 Encounter for screening for malignant neoplasm of prostate: Secondary | ICD-10-CM | POA: Diagnosis not present

## 2022-02-14 DIAGNOSIS — Z87891 Personal history of nicotine dependence: Secondary | ICD-10-CM | POA: Diagnosis not present

## 2022-02-14 DIAGNOSIS — I2510 Atherosclerotic heart disease of native coronary artery without angina pectoris: Secondary | ICD-10-CM | POA: Diagnosis not present

## 2022-02-14 DIAGNOSIS — Z0000 Encounter for general adult medical examination without abnormal findings: Secondary | ICD-10-CM | POA: Diagnosis not present

## 2022-02-14 DIAGNOSIS — N2581 Secondary hyperparathyroidism of renal origin: Secondary | ICD-10-CM | POA: Diagnosis not present

## 2022-02-14 DIAGNOSIS — N1832 Chronic kidney disease, stage 3b: Secondary | ICD-10-CM | POA: Diagnosis not present

## 2022-02-14 DIAGNOSIS — I129 Hypertensive chronic kidney disease with stage 1 through stage 4 chronic kidney disease, or unspecified chronic kidney disease: Secondary | ICD-10-CM | POA: Diagnosis not present

## 2022-02-14 DIAGNOSIS — R600 Localized edema: Secondary | ICD-10-CM | POA: Diagnosis not present

## 2022-02-14 DIAGNOSIS — Z951 Presence of aortocoronary bypass graft: Secondary | ICD-10-CM | POA: Diagnosis not present

## 2022-02-25 IMAGING — DX DG CHEST 1V PORT
1 series · 1 of 1 positions shown · non-contrast
Comparison: June 23, 2020.

CLINICAL DATA: Status post CABG.

EXAM:
PORTABLE CHEST 1 VIEW

[chest]
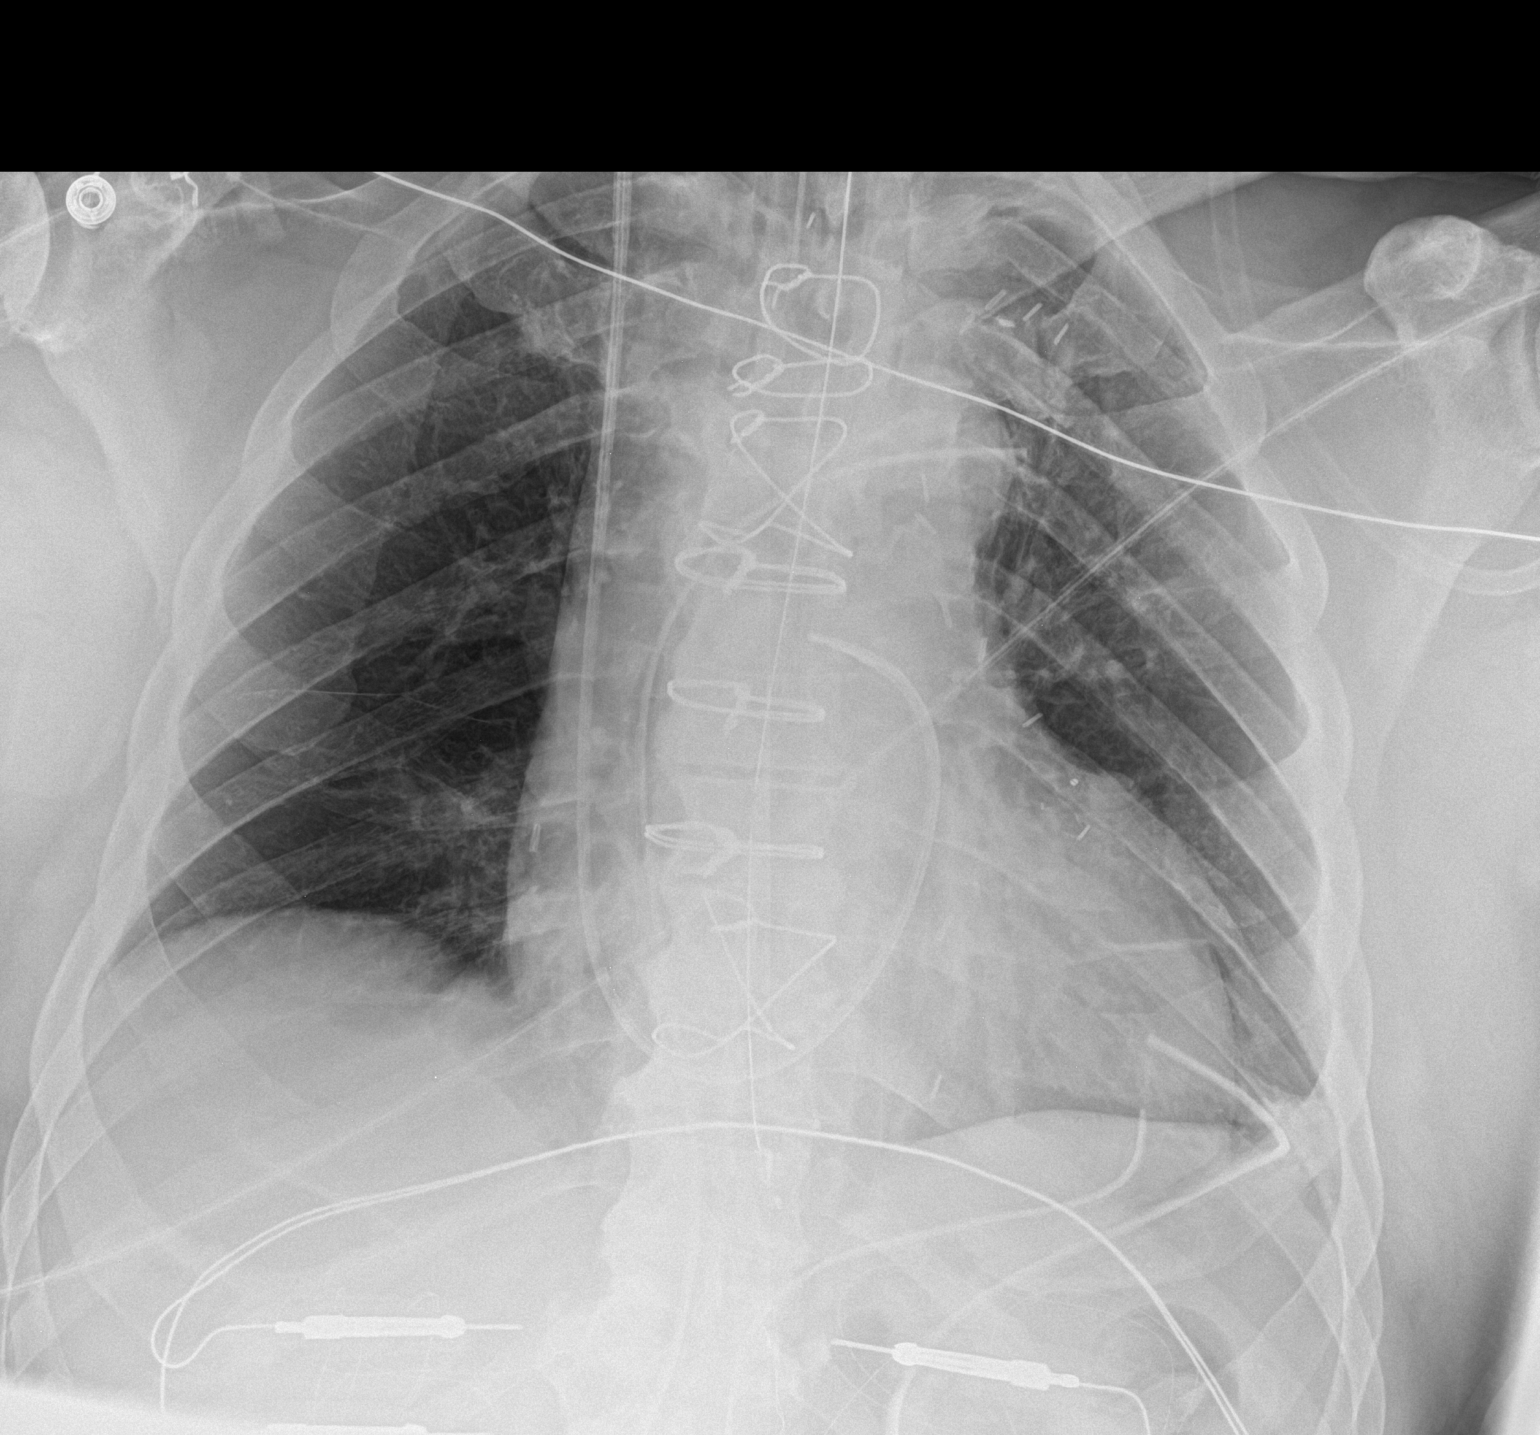

[1 of 1 positions shown; findings below may reference images not displayed]

FINDINGS: Postsurgical changes of CABG with median sternotomy. Similar
cardiomediastinal silhouette. Right IJ approach Swan-Ganz catheter
with the tip projecting in the region of the main pulmonary artery.
Mediastinal drain and left chest tubes in place. Endotracheal tube
tip projects at the inferior aspect of the clavicular heads. Gastric
tube courses below the diaphragm with the tip likely just above the
gastroesophageal junction and the side port above the diaphragm.
Linear left basilar opacities, likely atelectasis in the
postoperative setting. No definite pleural effusions or visible
pneumothorax on this single semi erect radiograph.
IMPRESSION: 1. Gastric tube tip projects just above the gastroesophageal
junction. If intragastric position is desired, recommend
advancement. Additional support devices are detailed above.
2. Postsurgical changes of CABG with streaky left basilar opacities,
likely atelectasis in the postoperative setting.

## 2022-05-08 DIAGNOSIS — I129 Hypertensive chronic kidney disease with stage 1 through stage 4 chronic kidney disease, or unspecified chronic kidney disease: Secondary | ICD-10-CM | POA: Diagnosis not present

## 2022-05-08 DIAGNOSIS — R809 Proteinuria, unspecified: Secondary | ICD-10-CM | POA: Diagnosis not present

## 2022-05-08 DIAGNOSIS — N184 Chronic kidney disease, stage 4 (severe): Secondary | ICD-10-CM | POA: Diagnosis not present

## 2022-05-08 DIAGNOSIS — N2581 Secondary hyperparathyroidism of renal origin: Secondary | ICD-10-CM | POA: Diagnosis not present

## 2022-05-08 DIAGNOSIS — R600 Localized edema: Secondary | ICD-10-CM | POA: Diagnosis not present

## 2022-05-15 DIAGNOSIS — N2581 Secondary hyperparathyroidism of renal origin: Secondary | ICD-10-CM | POA: Diagnosis not present

## 2022-05-15 DIAGNOSIS — N184 Chronic kidney disease, stage 4 (severe): Secondary | ICD-10-CM | POA: Diagnosis not present

## 2022-05-15 DIAGNOSIS — R600 Localized edema: Secondary | ICD-10-CM | POA: Diagnosis not present

## 2022-05-15 DIAGNOSIS — I129 Hypertensive chronic kidney disease with stage 1 through stage 4 chronic kidney disease, or unspecified chronic kidney disease: Secondary | ICD-10-CM | POA: Diagnosis not present

## 2022-05-15 DIAGNOSIS — R809 Proteinuria, unspecified: Secondary | ICD-10-CM | POA: Diagnosis not present

## 2022-05-17 DIAGNOSIS — Z951 Presence of aortocoronary bypass graft: Secondary | ICD-10-CM | POA: Diagnosis not present

## 2022-05-17 DIAGNOSIS — I129 Hypertensive chronic kidney disease with stage 1 through stage 4 chronic kidney disease, or unspecified chronic kidney disease: Secondary | ICD-10-CM | POA: Diagnosis not present

## 2022-05-17 DIAGNOSIS — I2510 Atherosclerotic heart disease of native coronary artery without angina pectoris: Secondary | ICD-10-CM | POA: Diagnosis not present

## 2022-06-27 DIAGNOSIS — I129 Hypertensive chronic kidney disease with stage 1 through stage 4 chronic kidney disease, or unspecified chronic kidney disease: Secondary | ICD-10-CM | POA: Diagnosis not present

## 2022-06-27 DIAGNOSIS — R609 Edema, unspecified: Secondary | ICD-10-CM | POA: Diagnosis not present

## 2022-06-27 DIAGNOSIS — Z951 Presence of aortocoronary bypass graft: Secondary | ICD-10-CM | POA: Diagnosis not present

## 2022-06-27 DIAGNOSIS — I2510 Atherosclerotic heart disease of native coronary artery without angina pectoris: Secondary | ICD-10-CM | POA: Diagnosis not present

## 2022-07-18 DIAGNOSIS — I129 Hypertensive chronic kidney disease with stage 1 through stage 4 chronic kidney disease, or unspecified chronic kidney disease: Secondary | ICD-10-CM | POA: Diagnosis not present

## 2022-07-18 DIAGNOSIS — R809 Proteinuria, unspecified: Secondary | ICD-10-CM | POA: Diagnosis not present

## 2022-07-18 DIAGNOSIS — R600 Localized edema: Secondary | ICD-10-CM | POA: Diagnosis not present

## 2022-07-18 DIAGNOSIS — N2581 Secondary hyperparathyroidism of renal origin: Secondary | ICD-10-CM | POA: Diagnosis not present

## 2022-07-18 DIAGNOSIS — N184 Chronic kidney disease, stage 4 (severe): Secondary | ICD-10-CM | POA: Diagnosis not present

## 2022-07-25 DIAGNOSIS — R600 Localized edema: Secondary | ICD-10-CM | POA: Diagnosis not present

## 2022-07-25 DIAGNOSIS — N2581 Secondary hyperparathyroidism of renal origin: Secondary | ICD-10-CM | POA: Diagnosis not present

## 2022-07-25 DIAGNOSIS — N184 Chronic kidney disease, stage 4 (severe): Secondary | ICD-10-CM | POA: Diagnosis not present

## 2022-07-25 DIAGNOSIS — D631 Anemia in chronic kidney disease: Secondary | ICD-10-CM | POA: Diagnosis not present

## 2022-07-25 DIAGNOSIS — I129 Hypertensive chronic kidney disease with stage 1 through stage 4 chronic kidney disease, or unspecified chronic kidney disease: Secondary | ICD-10-CM | POA: Diagnosis not present

## 2022-07-25 DIAGNOSIS — R809 Proteinuria, unspecified: Secondary | ICD-10-CM | POA: Diagnosis not present

## 2022-08-07 DIAGNOSIS — Z951 Presence of aortocoronary bypass graft: Secondary | ICD-10-CM | POA: Diagnosis not present

## 2022-08-07 DIAGNOSIS — I10 Essential (primary) hypertension: Secondary | ICD-10-CM | POA: Diagnosis not present

## 2022-08-07 DIAGNOSIS — E538 Deficiency of other specified B group vitamins: Secondary | ICD-10-CM | POA: Diagnosis not present

## 2022-08-07 DIAGNOSIS — I2510 Atherosclerotic heart disease of native coronary artery without angina pectoris: Secondary | ICD-10-CM | POA: Diagnosis not present

## 2022-08-07 DIAGNOSIS — R600 Localized edema: Secondary | ICD-10-CM | POA: Diagnosis not present

## 2022-08-07 DIAGNOSIS — D649 Anemia, unspecified: Secondary | ICD-10-CM | POA: Diagnosis not present

## 2022-08-07 DIAGNOSIS — N184 Chronic kidney disease, stage 4 (severe): Secondary | ICD-10-CM | POA: Diagnosis not present

## 2022-08-07 DIAGNOSIS — Z0000 Encounter for general adult medical examination without abnormal findings: Secondary | ICD-10-CM | POA: Diagnosis not present

## 2022-08-07 DIAGNOSIS — Z125 Encounter for screening for malignant neoplasm of prostate: Secondary | ICD-10-CM | POA: Diagnosis not present

## 2022-08-07 DIAGNOSIS — N2581 Secondary hyperparathyroidism of renal origin: Secondary | ICD-10-CM | POA: Diagnosis not present

## 2022-08-14 DIAGNOSIS — E538 Deficiency of other specified B group vitamins: Secondary | ICD-10-CM | POA: Diagnosis not present

## 2022-08-14 DIAGNOSIS — I2510 Atherosclerotic heart disease of native coronary artery without angina pectoris: Secondary | ICD-10-CM | POA: Diagnosis not present

## 2022-08-14 DIAGNOSIS — N184 Chronic kidney disease, stage 4 (severe): Secondary | ICD-10-CM | POA: Diagnosis not present

## 2022-08-14 DIAGNOSIS — N2581 Secondary hyperparathyroidism of renal origin: Secondary | ICD-10-CM | POA: Diagnosis not present

## 2022-08-14 DIAGNOSIS — Z87891 Personal history of nicotine dependence: Secondary | ICD-10-CM | POA: Diagnosis not present

## 2022-08-14 DIAGNOSIS — Z951 Presence of aortocoronary bypass graft: Secondary | ICD-10-CM | POA: Diagnosis not present

## 2022-08-14 DIAGNOSIS — I129 Hypertensive chronic kidney disease with stage 1 through stage 4 chronic kidney disease, or unspecified chronic kidney disease: Secondary | ICD-10-CM | POA: Diagnosis not present

## 2022-11-01 DIAGNOSIS — R809 Proteinuria, unspecified: Secondary | ICD-10-CM | POA: Diagnosis not present

## 2022-11-01 DIAGNOSIS — R600 Localized edema: Secondary | ICD-10-CM | POA: Diagnosis not present

## 2022-11-01 DIAGNOSIS — I129 Hypertensive chronic kidney disease with stage 1 through stage 4 chronic kidney disease, or unspecified chronic kidney disease: Secondary | ICD-10-CM | POA: Diagnosis not present

## 2022-11-01 DIAGNOSIS — D631 Anemia in chronic kidney disease: Secondary | ICD-10-CM | POA: Diagnosis not present

## 2022-11-01 DIAGNOSIS — N184 Chronic kidney disease, stage 4 (severe): Secondary | ICD-10-CM | POA: Diagnosis not present

## 2022-11-01 DIAGNOSIS — N2581 Secondary hyperparathyroidism of renal origin: Secondary | ICD-10-CM | POA: Diagnosis not present

## 2022-11-06 DIAGNOSIS — E785 Hyperlipidemia, unspecified: Secondary | ICD-10-CM | POA: Diagnosis not present

## 2022-11-06 DIAGNOSIS — D631 Anemia in chronic kidney disease: Secondary | ICD-10-CM | POA: Diagnosis not present

## 2022-11-06 DIAGNOSIS — R809 Proteinuria, unspecified: Secondary | ICD-10-CM | POA: Diagnosis not present

## 2022-11-06 DIAGNOSIS — N184 Chronic kidney disease, stage 4 (severe): Secondary | ICD-10-CM | POA: Diagnosis not present

## 2022-11-06 DIAGNOSIS — I129 Hypertensive chronic kidney disease with stage 1 through stage 4 chronic kidney disease, or unspecified chronic kidney disease: Secondary | ICD-10-CM | POA: Diagnosis not present

## 2022-11-06 DIAGNOSIS — N2581 Secondary hyperparathyroidism of renal origin: Secondary | ICD-10-CM | POA: Diagnosis not present

## 2022-12-18 DIAGNOSIS — I10 Essential (primary) hypertension: Secondary | ICD-10-CM | POA: Diagnosis not present

## 2022-12-24 DIAGNOSIS — D2262 Melanocytic nevi of left upper limb, including shoulder: Secondary | ICD-10-CM | POA: Diagnosis not present

## 2022-12-24 DIAGNOSIS — D2261 Melanocytic nevi of right upper limb, including shoulder: Secondary | ICD-10-CM | POA: Diagnosis not present

## 2022-12-24 DIAGNOSIS — D225 Melanocytic nevi of trunk: Secondary | ICD-10-CM | POA: Diagnosis not present

## 2022-12-24 DIAGNOSIS — D2371 Other benign neoplasm of skin of right lower limb, including hip: Secondary | ICD-10-CM | POA: Diagnosis not present

## 2022-12-24 DIAGNOSIS — D485 Neoplasm of uncertain behavior of skin: Secondary | ICD-10-CM | POA: Diagnosis not present

## 2022-12-24 DIAGNOSIS — D2272 Melanocytic nevi of left lower limb, including hip: Secondary | ICD-10-CM | POA: Diagnosis not present

## 2022-12-24 DIAGNOSIS — D2271 Melanocytic nevi of right lower limb, including hip: Secondary | ICD-10-CM | POA: Diagnosis not present

## 2022-12-24 DIAGNOSIS — L821 Other seborrheic keratosis: Secondary | ICD-10-CM | POA: Diagnosis not present

## 2023-02-04 DIAGNOSIS — N184 Chronic kidney disease, stage 4 (severe): Secondary | ICD-10-CM | POA: Diagnosis not present

## 2023-02-04 DIAGNOSIS — E785 Hyperlipidemia, unspecified: Secondary | ICD-10-CM | POA: Diagnosis not present

## 2023-02-04 DIAGNOSIS — N2581 Secondary hyperparathyroidism of renal origin: Secondary | ICD-10-CM | POA: Diagnosis not present

## 2023-02-04 DIAGNOSIS — R809 Proteinuria, unspecified: Secondary | ICD-10-CM | POA: Diagnosis not present

## 2023-02-04 DIAGNOSIS — D631 Anemia in chronic kidney disease: Secondary | ICD-10-CM | POA: Diagnosis not present

## 2023-02-04 DIAGNOSIS — I129 Hypertensive chronic kidney disease with stage 1 through stage 4 chronic kidney disease, or unspecified chronic kidney disease: Secondary | ICD-10-CM | POA: Diagnosis not present

## 2023-02-05 DIAGNOSIS — D2371 Other benign neoplasm of skin of right lower limb, including hip: Secondary | ICD-10-CM | POA: Diagnosis not present

## 2023-02-05 DIAGNOSIS — D2271 Melanocytic nevi of right lower limb, including hip: Secondary | ICD-10-CM | POA: Diagnosis not present

## 2023-02-06 DIAGNOSIS — N184 Chronic kidney disease, stage 4 (severe): Secondary | ICD-10-CM | POA: Diagnosis not present

## 2023-02-06 DIAGNOSIS — R809 Proteinuria, unspecified: Secondary | ICD-10-CM | POA: Diagnosis not present

## 2023-02-06 DIAGNOSIS — E785 Hyperlipidemia, unspecified: Secondary | ICD-10-CM | POA: Diagnosis not present

## 2023-02-06 DIAGNOSIS — D631 Anemia in chronic kidney disease: Secondary | ICD-10-CM | POA: Diagnosis not present

## 2023-02-06 DIAGNOSIS — N2581 Secondary hyperparathyroidism of renal origin: Secondary | ICD-10-CM | POA: Diagnosis not present

## 2023-02-06 DIAGNOSIS — I129 Hypertensive chronic kidney disease with stage 1 through stage 4 chronic kidney disease, or unspecified chronic kidney disease: Secondary | ICD-10-CM | POA: Diagnosis not present

## 2023-02-19 DIAGNOSIS — Z87891 Personal history of nicotine dependence: Secondary | ICD-10-CM | POA: Diagnosis not present

## 2023-02-19 DIAGNOSIS — Z0000 Encounter for general adult medical examination without abnormal findings: Secondary | ICD-10-CM | POA: Diagnosis not present

## 2023-02-19 DIAGNOSIS — D631 Anemia in chronic kidney disease: Secondary | ICD-10-CM | POA: Diagnosis not present

## 2023-02-19 DIAGNOSIS — N184 Chronic kidney disease, stage 4 (severe): Secondary | ICD-10-CM | POA: Diagnosis not present

## 2023-02-19 DIAGNOSIS — Z951 Presence of aortocoronary bypass graft: Secondary | ICD-10-CM | POA: Diagnosis not present

## 2023-02-19 DIAGNOSIS — I129 Hypertensive chronic kidney disease with stage 1 through stage 4 chronic kidney disease, or unspecified chronic kidney disease: Secondary | ICD-10-CM | POA: Diagnosis not present

## 2023-02-19 DIAGNOSIS — Z1389 Encounter for screening for other disorder: Secondary | ICD-10-CM | POA: Diagnosis not present

## 2023-05-02 DIAGNOSIS — N184 Chronic kidney disease, stage 4 (severe): Secondary | ICD-10-CM | POA: Diagnosis not present

## 2023-05-02 DIAGNOSIS — D631 Anemia in chronic kidney disease: Secondary | ICD-10-CM | POA: Diagnosis not present

## 2023-05-02 DIAGNOSIS — E785 Hyperlipidemia, unspecified: Secondary | ICD-10-CM | POA: Diagnosis not present

## 2023-05-02 DIAGNOSIS — R809 Proteinuria, unspecified: Secondary | ICD-10-CM | POA: Diagnosis not present

## 2023-05-02 DIAGNOSIS — I129 Hypertensive chronic kidney disease with stage 1 through stage 4 chronic kidney disease, or unspecified chronic kidney disease: Secondary | ICD-10-CM | POA: Diagnosis not present

## 2023-05-07 DIAGNOSIS — R600 Localized edema: Secondary | ICD-10-CM | POA: Diagnosis not present

## 2023-05-07 DIAGNOSIS — N2581 Secondary hyperparathyroidism of renal origin: Secondary | ICD-10-CM | POA: Diagnosis not present

## 2023-05-07 DIAGNOSIS — D631 Anemia in chronic kidney disease: Secondary | ICD-10-CM | POA: Diagnosis not present

## 2023-05-07 DIAGNOSIS — N184 Chronic kidney disease, stage 4 (severe): Secondary | ICD-10-CM | POA: Diagnosis not present

## 2023-05-07 DIAGNOSIS — I129 Hypertensive chronic kidney disease with stage 1 through stage 4 chronic kidney disease, or unspecified chronic kidney disease: Secondary | ICD-10-CM | POA: Diagnosis not present

## 2023-05-07 DIAGNOSIS — E785 Hyperlipidemia, unspecified: Secondary | ICD-10-CM | POA: Diagnosis not present

## 2023-05-07 DIAGNOSIS — R809 Proteinuria, unspecified: Secondary | ICD-10-CM | POA: Diagnosis not present

## 2023-06-13 DIAGNOSIS — I2510 Atherosclerotic heart disease of native coronary artery without angina pectoris: Secondary | ICD-10-CM | POA: Diagnosis not present

## 2023-06-13 DIAGNOSIS — N184 Chronic kidney disease, stage 4 (severe): Secondary | ICD-10-CM | POA: Diagnosis not present

## 2023-06-13 DIAGNOSIS — I10 Essential (primary) hypertension: Secondary | ICD-10-CM | POA: Diagnosis not present

## 2023-06-13 DIAGNOSIS — Z951 Presence of aortocoronary bypass graft: Secondary | ICD-10-CM | POA: Diagnosis not present

## 2023-06-13 DIAGNOSIS — R609 Edema, unspecified: Secondary | ICD-10-CM | POA: Diagnosis not present

## 2023-08-01 DIAGNOSIS — R600 Localized edema: Secondary | ICD-10-CM | POA: Diagnosis not present

## 2023-08-01 DIAGNOSIS — D631 Anemia in chronic kidney disease: Secondary | ICD-10-CM | POA: Diagnosis not present

## 2023-08-01 DIAGNOSIS — N2581 Secondary hyperparathyroidism of renal origin: Secondary | ICD-10-CM | POA: Diagnosis not present

## 2023-08-01 DIAGNOSIS — E785 Hyperlipidemia, unspecified: Secondary | ICD-10-CM | POA: Diagnosis not present

## 2023-08-01 DIAGNOSIS — R809 Proteinuria, unspecified: Secondary | ICD-10-CM | POA: Diagnosis not present

## 2023-08-01 DIAGNOSIS — I129 Hypertensive chronic kidney disease with stage 1 through stage 4 chronic kidney disease, or unspecified chronic kidney disease: Secondary | ICD-10-CM | POA: Diagnosis not present

## 2023-08-06 DIAGNOSIS — H43813 Vitreous degeneration, bilateral: Secondary | ICD-10-CM | POA: Diagnosis not present

## 2023-08-06 DIAGNOSIS — Z961 Presence of intraocular lens: Secondary | ICD-10-CM | POA: Diagnosis not present

## 2023-08-07 DIAGNOSIS — N184 Chronic kidney disease, stage 4 (severe): Secondary | ICD-10-CM | POA: Diagnosis not present

## 2023-08-07 DIAGNOSIS — I129 Hypertensive chronic kidney disease with stage 1 through stage 4 chronic kidney disease, or unspecified chronic kidney disease: Secondary | ICD-10-CM | POA: Diagnosis not present

## 2023-08-07 DIAGNOSIS — D631 Anemia in chronic kidney disease: Secondary | ICD-10-CM | POA: Diagnosis not present

## 2023-08-07 DIAGNOSIS — E785 Hyperlipidemia, unspecified: Secondary | ICD-10-CM | POA: Diagnosis not present

## 2023-08-07 DIAGNOSIS — R600 Localized edema: Secondary | ICD-10-CM | POA: Diagnosis not present

## 2023-08-07 DIAGNOSIS — N2581 Secondary hyperparathyroidism of renal origin: Secondary | ICD-10-CM | POA: Diagnosis not present

## 2023-08-07 DIAGNOSIS — R809 Proteinuria, unspecified: Secondary | ICD-10-CM | POA: Diagnosis not present

## 2023-08-20 DIAGNOSIS — D631 Anemia in chronic kidney disease: Secondary | ICD-10-CM | POA: Diagnosis not present

## 2023-08-20 DIAGNOSIS — N184 Chronic kidney disease, stage 4 (severe): Secondary | ICD-10-CM | POA: Diagnosis not present

## 2023-08-20 DIAGNOSIS — I129 Hypertensive chronic kidney disease with stage 1 through stage 4 chronic kidney disease, or unspecified chronic kidney disease: Secondary | ICD-10-CM | POA: Diagnosis not present

## 2023-08-20 DIAGNOSIS — I2510 Atherosclerotic heart disease of native coronary artery without angina pectoris: Secondary | ICD-10-CM | POA: Diagnosis not present

## 2023-08-20 DIAGNOSIS — N2581 Secondary hyperparathyroidism of renal origin: Secondary | ICD-10-CM | POA: Diagnosis not present

## 2023-08-20 DIAGNOSIS — Z87891 Personal history of nicotine dependence: Secondary | ICD-10-CM | POA: Diagnosis not present

## 2023-08-29 DIAGNOSIS — N184 Chronic kidney disease, stage 4 (severe): Secondary | ICD-10-CM | POA: Diagnosis not present

## 2023-08-29 DIAGNOSIS — R600 Localized edema: Secondary | ICD-10-CM | POA: Diagnosis not present

## 2023-08-29 DIAGNOSIS — N2581 Secondary hyperparathyroidism of renal origin: Secondary | ICD-10-CM | POA: Diagnosis not present

## 2023-08-29 DIAGNOSIS — R809 Proteinuria, unspecified: Secondary | ICD-10-CM | POA: Diagnosis not present

## 2023-08-29 DIAGNOSIS — E785 Hyperlipidemia, unspecified: Secondary | ICD-10-CM | POA: Diagnosis not present

## 2023-08-29 DIAGNOSIS — D631 Anemia in chronic kidney disease: Secondary | ICD-10-CM | POA: Diagnosis not present

## 2023-09-04 DIAGNOSIS — E785 Hyperlipidemia, unspecified: Secondary | ICD-10-CM | POA: Diagnosis not present

## 2023-09-04 DIAGNOSIS — D631 Anemia in chronic kidney disease: Secondary | ICD-10-CM | POA: Diagnosis not present

## 2023-09-04 DIAGNOSIS — N2581 Secondary hyperparathyroidism of renal origin: Secondary | ICD-10-CM | POA: Diagnosis not present

## 2023-09-04 DIAGNOSIS — I129 Hypertensive chronic kidney disease with stage 1 through stage 4 chronic kidney disease, or unspecified chronic kidney disease: Secondary | ICD-10-CM | POA: Diagnosis not present

## 2023-09-04 DIAGNOSIS — R809 Proteinuria, unspecified: Secondary | ICD-10-CM | POA: Diagnosis not present

## 2023-09-04 DIAGNOSIS — N184 Chronic kidney disease, stage 4 (severe): Secondary | ICD-10-CM | POA: Diagnosis not present

## 2023-12-02 DIAGNOSIS — N184 Chronic kidney disease, stage 4 (severe): Secondary | ICD-10-CM | POA: Diagnosis not present

## 2023-12-02 DIAGNOSIS — R809 Proteinuria, unspecified: Secondary | ICD-10-CM | POA: Diagnosis not present

## 2023-12-02 DIAGNOSIS — I129 Hypertensive chronic kidney disease with stage 1 through stage 4 chronic kidney disease, or unspecified chronic kidney disease: Secondary | ICD-10-CM | POA: Diagnosis not present

## 2023-12-02 DIAGNOSIS — D631 Anemia in chronic kidney disease: Secondary | ICD-10-CM | POA: Diagnosis not present

## 2023-12-02 DIAGNOSIS — N2581 Secondary hyperparathyroidism of renal origin: Secondary | ICD-10-CM | POA: Diagnosis not present

## 2023-12-02 DIAGNOSIS — E785 Hyperlipidemia, unspecified: Secondary | ICD-10-CM | POA: Diagnosis not present

## 2023-12-04 DIAGNOSIS — N2581 Secondary hyperparathyroidism of renal origin: Secondary | ICD-10-CM | POA: Diagnosis not present

## 2023-12-04 DIAGNOSIS — I129 Hypertensive chronic kidney disease with stage 1 through stage 4 chronic kidney disease, or unspecified chronic kidney disease: Secondary | ICD-10-CM | POA: Diagnosis not present

## 2023-12-04 DIAGNOSIS — E785 Hyperlipidemia, unspecified: Secondary | ICD-10-CM | POA: Diagnosis not present

## 2023-12-04 DIAGNOSIS — D631 Anemia in chronic kidney disease: Secondary | ICD-10-CM | POA: Diagnosis not present

## 2023-12-04 DIAGNOSIS — N184 Chronic kidney disease, stage 4 (severe): Secondary | ICD-10-CM | POA: Diagnosis not present

## 2023-12-04 DIAGNOSIS — N179 Acute kidney failure, unspecified: Secondary | ICD-10-CM | POA: Diagnosis not present

## 2023-12-04 DIAGNOSIS — R809 Proteinuria, unspecified: Secondary | ICD-10-CM | POA: Diagnosis not present

## 2023-12-09 ENCOUNTER — Other Ambulatory Visit: Payer: Self-pay | Admitting: Nephrology

## 2023-12-09 DIAGNOSIS — I129 Hypertensive chronic kidney disease with stage 1 through stage 4 chronic kidney disease, or unspecified chronic kidney disease: Secondary | ICD-10-CM

## 2023-12-09 DIAGNOSIS — N184 Chronic kidney disease, stage 4 (severe): Secondary | ICD-10-CM

## 2023-12-12 ENCOUNTER — Ambulatory Visit: Admission: RE | Admit: 2023-12-12 | Discharge: 2023-12-12 | Attending: Nephrology | Admitting: Nephrology

## 2023-12-12 DIAGNOSIS — I129 Hypertensive chronic kidney disease with stage 1 through stage 4 chronic kidney disease, or unspecified chronic kidney disease: Secondary | ICD-10-CM | POA: Diagnosis not present

## 2023-12-12 DIAGNOSIS — N184 Chronic kidney disease, stage 4 (severe): Secondary | ICD-10-CM | POA: Diagnosis not present

## 2023-12-12 DIAGNOSIS — N281 Cyst of kidney, acquired: Secondary | ICD-10-CM | POA: Diagnosis not present
